# Patient Record
Sex: Female | Born: 1970 | Race: Black or African American | Hispanic: No | Marital: Single | Smoking: Current every day smoker
Health system: Southern US, Community
[De-identification: ages and names within clinical notes are randomized; demographics above are authoritative.]

## PROBLEM LIST (undated history)

## (undated) DIAGNOSIS — Z8489 Family history of other specified conditions: Secondary | ICD-10-CM

## (undated) DIAGNOSIS — Z1159 Encounter for screening for other viral diseases: Secondary | ICD-10-CM

## (undated) DIAGNOSIS — B009 Herpesviral infection, unspecified: Secondary | ICD-10-CM

## (undated) DIAGNOSIS — R768 Other specified abnormal immunological findings in serum: Secondary | ICD-10-CM

## (undated) DIAGNOSIS — M5030 Other cervical disc degeneration, unspecified cervical region: Secondary | ICD-10-CM

## (undated) DIAGNOSIS — G43909 Migraine, unspecified, not intractable, without status migrainosus: Secondary | ICD-10-CM

## (undated) DIAGNOSIS — R87619 Unspecified abnormal cytological findings in specimens from cervix uteri: Secondary | ICD-10-CM

## (undated) HISTORY — DX: Other specified abnormal immunological findings in serum: R76.8

## (undated) HISTORY — PX: LAPAROSCOPY: SHX197

## (undated) HISTORY — PX: DILATION AND CURETTAGE OF UTERUS: SHX78

## (undated) HISTORY — PX: CHOLECYSTECTOMY: SHX55

## (undated) HISTORY — DX: Encounter for screening for other viral diseases: Z11.59

## (undated) HISTORY — DX: Herpesviral infection, unspecified: B00.9

## (undated) HISTORY — PX: CERVICAL BIOPSY  W/ LOOP ELECTRODE EXCISION: SUR135

## (undated) HISTORY — DX: Unspecified abnormal cytological findings in specimens from cervix uteri: R87.619

## (undated) HISTORY — DX: Other cervical disc degeneration, unspecified cervical region: M50.30

## (undated) HISTORY — PX: BACK SURGERY: SHX140

## (undated) HISTORY — PX: CRYOTHERAPY: SHX1416

---

## 2003-11-09 ENCOUNTER — Other Ambulatory Visit: Payer: Self-pay

## 2004-02-16 ENCOUNTER — Emergency Department: Payer: Self-pay | Admitting: Unknown Physician Specialty

## 2004-02-18 HISTORY — PX: TUBAL LIGATION: SHX77

## 2004-04-12 ENCOUNTER — Emergency Department: Payer: Self-pay | Admitting: Emergency Medicine

## 2004-04-15 ENCOUNTER — Ambulatory Visit: Payer: Self-pay | Admitting: Emergency Medicine

## 2004-10-11 ENCOUNTER — Emergency Department: Payer: Self-pay | Admitting: Emergency Medicine

## 2004-10-12 ENCOUNTER — Other Ambulatory Visit: Payer: Self-pay

## 2004-10-15 ENCOUNTER — Ambulatory Visit: Payer: Self-pay

## 2005-03-16 ENCOUNTER — Emergency Department: Payer: Self-pay | Admitting: Emergency Medicine

## 2005-06-07 ENCOUNTER — Emergency Department: Payer: Self-pay | Admitting: Emergency Medicine

## 2006-01-11 ENCOUNTER — Emergency Department: Payer: Self-pay | Admitting: Internal Medicine

## 2006-01-14 ENCOUNTER — Emergency Department: Payer: Self-pay | Admitting: Internal Medicine

## 2006-01-16 ENCOUNTER — Ambulatory Visit: Payer: Self-pay | Admitting: Internal Medicine

## 2006-02-05 ENCOUNTER — Ambulatory Visit: Payer: Self-pay | Admitting: Pain Medicine

## 2006-03-24 ENCOUNTER — Ambulatory Visit: Payer: Self-pay | Admitting: Pain Medicine

## 2006-04-08 ENCOUNTER — Emergency Department: Payer: Self-pay | Admitting: Internal Medicine

## 2006-05-05 ENCOUNTER — Ambulatory Visit: Payer: Self-pay | Admitting: Pain Medicine

## 2006-05-20 ENCOUNTER — Ambulatory Visit: Payer: Self-pay | Admitting: Physician Assistant

## 2006-08-03 ENCOUNTER — Ambulatory Visit: Payer: Self-pay | Admitting: Unknown Physician Specialty

## 2006-08-10 ENCOUNTER — Observation Stay: Payer: Self-pay | Admitting: Unknown Physician Specialty

## 2006-10-26 ENCOUNTER — Emergency Department: Payer: Self-pay | Admitting: Emergency Medicine

## 2006-10-28 ENCOUNTER — Ambulatory Visit: Payer: Self-pay | Admitting: Physician Assistant

## 2007-11-15 ENCOUNTER — Ambulatory Visit: Payer: Self-pay | Admitting: Internal Medicine

## 2008-11-21 ENCOUNTER — Ambulatory Visit: Payer: Self-pay | Admitting: Neurosurgery

## 2009-05-31 ENCOUNTER — Ambulatory Visit: Payer: Self-pay | Admitting: Internal Medicine

## 2010-01-14 ENCOUNTER — Emergency Department: Payer: Self-pay | Admitting: Emergency Medicine

## 2010-12-05 ENCOUNTER — Emergency Department: Payer: Self-pay | Admitting: Emergency Medicine

## 2014-06-18 ENCOUNTER — Emergency Department
Admission: EM | Admit: 2014-06-18 | Discharge: 2014-06-18 | Disposition: A | Discharge status: Home / Self Care | Payer: Medicaid Other | Attending: Student | Admitting: Student

## 2014-06-18 ENCOUNTER — Emergency Department: Payer: Medicaid Other

## 2014-06-18 ENCOUNTER — Encounter: Payer: Self-pay | Admitting: *Deleted

## 2014-06-18 DIAGNOSIS — Y998 Other external cause status: Secondary | ICD-10-CM | POA: Insufficient documentation

## 2014-06-18 DIAGNOSIS — R52 Pain, unspecified: Secondary | ICD-10-CM

## 2014-06-18 DIAGNOSIS — Y9389 Activity, other specified: Secondary | ICD-10-CM | POA: Insufficient documentation

## 2014-06-18 DIAGNOSIS — S3992XA Unspecified injury of lower back, initial encounter: Secondary | ICD-10-CM | POA: Diagnosis not present

## 2014-06-18 DIAGNOSIS — Y9289 Other specified places as the place of occurrence of the external cause: Secondary | ICD-10-CM | POA: Insufficient documentation

## 2014-06-18 DIAGNOSIS — S4992XA Unspecified injury of left shoulder and upper arm, initial encounter: Secondary | ICD-10-CM | POA: Diagnosis present

## 2014-06-18 DIAGNOSIS — X58XXXA Exposure to other specified factors, initial encounter: Secondary | ICD-10-CM | POA: Insufficient documentation

## 2014-06-18 DIAGNOSIS — S46912A Strain of unspecified muscle, fascia and tendon at shoulder and upper arm level, left arm, initial encounter: Secondary | ICD-10-CM

## 2014-06-18 DIAGNOSIS — Z720 Tobacco use: Secondary | ICD-10-CM | POA: Insufficient documentation

## 2014-06-18 MED ORDER — CYCLOBENZAPRINE HCL 10 MG PO TABS: 10.0000 mg | ORAL_TABLET | Freq: Three times a day (TID) | ORAL | Status: AC | PRN

## 2014-06-18 MED ORDER — IBUPROFEN 800 MG PO TABS: 800.0000 mg | ORAL_TABLET | Freq: Three times a day (TID) | ORAL | Status: DC | PRN

## 2014-06-18 NOTE — ED Notes (Signed)
Patient stated her pain was 8/10

## 2014-06-18 NOTE — ED Provider Notes (Signed)
[facility] Emergency Department Provider Note    ____________________________________________  Time seen: 1430 hrs. I have reviewed the triage vital signs and the nursing notes.   HISTORY  Chief Complaint Arm Injury  44 year old female complaining of left scapular and shoulder pain 2 days. Patient states she had mild discomfort roughhousing with her son 2 days ago.  Patient states pain increase yesterday upon awakening.      HPI Theresa Ryan is a 44 y.o. female complaining of left shoulder pain but 2 days. Patient stated mild discomfort status post roughhousing with son 2 days ago, awakened yesterday morning with increased pain. Patient states the location of the pain is the left scapular area radiating to the [facility] joint. Patient is currently rated the pain as 8/10 she describes pain as sharp. Denies any other physical exertion today area patient states pain increases when she abducts the arm.Patient states the pain decreases the hold that and affect physician across the body. Patient denies any loss of sensation to the left upper extremity.   History reviewed. No pertinent past medical history.  There are no active problems to display for this patient.   Past Surgical History  Procedure Laterality Date  . Back surgery    . Cholecystectomy    . Cesarean section      No current outpatient prescriptions on file.  Allergies Vicoprofen  No family history on file.  Social History History  Substance Use Topics  . Smoking status: Current Every Day Smoker  . Smokeless tobacco: Not on file  . Alcohol Use: Yes    Review of Systems  Constitutional: Negative for fever. Eyes: Negative for visual changes. ENT: Negative for sore throat. Cardiovascular: Negative for chest pain. Respiratory: Negative for shortness of breath. Gastrointestinal: Negative for abdominal pain, vomiting and diarrhea. Genitourinary: Negative for  dysuria. Musculoskeletal: Positive for back pain. Skin: Negative for rash. Neurological: Negative for headaches, focal weakness or numbness. Psychiatric:None Endocrine:None Hematological/Lymphatic: Allergic/Immunilogical: **}  10-point ROS otherwise negative.  ____________________________________________   PHYSICAL EXAM:  VITAL SIGNS: ED Triage Vitals  Enc Vitals Group     BP 06/18/14 1544 140/65 mmHg     Pulse Rate 06/18/14 1544 76     Resp --      Temp 06/18/14 1544 98.2 F (36.8 C)     Temp Source 06/18/14 1544 Oral     SpO2 06/18/14 1544 100 %     Weight 06/18/14 1544 140 lb (63.504 kg)     Height 06/18/14 1544  (1.626 m)     Head Cir --      Peak Flow --      Pain Score 06/18/14 1545 8     Pain Loc --      Pain Edu? --      Excl. in GC? --      Constitutional: Alert and oriented. Well appearing and in no distress. Eyes: Conjunctivae are normal. PERRL. Normal extraocular movements. ENT   Head: Normocephalic and atraumatic.   Nose: No congestion/rhinnorhea.   Mouth/Throat: Mucous membranes are moist.   Neck: No stridor. Hematological/Lymphatic/Immunilogical: No cervical lymphadenopathy. Cardiovascular: Normal rate, regular rhythm. Normal and symmetric distal pulses are present in all extremities. No murmurs, rubs, or gallops. Respiratory: Normal respiratory effort without tachypnea nor retractions. Breath sounds are clear and equal bilaterally. No wheezes/rales/rhonchi. Gastrointestinal: Soft and nontender. No distention. No abdominal bruits. There is no CVA tenderness. Genitourinary: Normal Musculoskeletal: Nontender with normal range of motion in all extremities. No joint  effusions.  No lower extremity tenderness nor edema. Neurologic:  Normal speech and language. No gross focal neurologic deficits are appreciated. Speech is normal. No gait instability. Skin:  Skin is warm, dry and intact. No rash noted. Psychiatric: Mood and affect are  normal. Speech and behavior are normal. Patient exhibits appropriate insight and judgment.  ____________________________________________   EKG    ____________________________________________    RADIOLOGY Negative left shoulder X-ray  ____________________________________________   PROCEDURES  Procedure(s) performed: None  Critical Care performed: None  ____________________________________________   INITIAL IMPRESSION / ASSESSMENT AND PLAN / ED COURSE  Pertinent labs & imaging results that were available during my care of the patient were reviewed by me and considered in my medical decision making (see chart for details).    ____________________________________________   FINAL CLINICAL IMPRESSION(S) / ED DIAGNOSES  Final diagnoses:  Pain aggravated by activities of daily living  Shoulder strain, left, initial encounter    Theresa ReiningRonald K Terilyn Sano, PA-C 06/18/14 1755  Gayla DossEryka A Gayle, MD 06/19/14 [phone]

## 2014-06-18 NOTE — ED Notes (Signed)
Pt reports she was "doing some machine" work on Friday and felt pain in her arm. Pt then states she was playing with her son and had worsening arm pain.

## 2014-08-10 ENCOUNTER — Encounter: Payer: Self-pay | Admitting: *Deleted

## 2014-08-10 ENCOUNTER — Emergency Department
Admission: EM | Admit: 2014-08-10 | Discharge: 2014-08-10 | Disposition: A | Discharge status: Home / Self Care | Payer: Medicaid Other | Attending: Emergency Medicine | Admitting: Emergency Medicine

## 2014-08-10 DIAGNOSIS — Z720 Tobacco use: Secondary | ICD-10-CM | POA: Diagnosis not present

## 2014-08-10 DIAGNOSIS — J039 Acute tonsillitis, unspecified: Secondary | ICD-10-CM | POA: Diagnosis not present

## 2014-08-10 DIAGNOSIS — J029 Acute pharyngitis, unspecified: Secondary | ICD-10-CM | POA: Diagnosis present

## 2014-08-10 LAB — POCT RAPID STREP A: Streptococcus, Group A Screen (Direct): NEGATIVE

## 2014-08-10 MED ORDER — AMOXICILLIN 500 MG PO CAPS: 500.0000 mg | ORAL_CAPSULE | Freq: Three times a day (TID) | ORAL | Status: DC

## 2014-08-10 NOTE — Discharge Instructions (Signed)
Tonsillitis Tonsillitis is an infection of the throat that causes the tonsils to become red, tender, and swollen. Tonsils are collections of lymphoid tissue at the back of the throat. Each tonsil has crevices (crypts). Tonsils help fight nose and throat infections and keep infection from spreading to other parts of the body for the first 18 months of life.  CAUSES Sudden (acute) tonsillitis is usually caused by infection with streptococcal bacteria. Long-lasting (chronic) tonsillitis occurs when the crypts of the tonsils become filled with pieces of food and bacteria, which makes it easy for the tonsils to become repeatedly infected. SYMPTOMS  Symptoms of tonsillitis include:  A sore throat, with possible difficulty swallowing.  White patches on the tonsils.  Fever.  Tiredness.  New episodes of snoring during sleep, when you did not snore before.  Small, foul-smelling, yellowish-white pieces of material (tonsilloliths) that you occasionally cough up or spit out. The tonsilloliths can also cause you to have bad breath. DIAGNOSIS Tonsillitis can be diagnosed through a physical exam. Diagnosis can be confirmed with the results of lab tests, including a throat culture. TREATMENT  The goals of tonsillitis treatment include the reduction of the severity and duration of symptoms and prevention of associated conditions. Symptoms of tonsillitis can be improved with the use of steroids to reduce the swelling. Tonsillitis caused by bacteria can be treated with antibiotic medicines. Usually, treatment with antibiotic medicines is started before the cause of the tonsillitis is known. However, if it is determined that the cause is not bacterial, antibiotic medicines will not treat the tonsillitis. If attacks of tonsillitis are severe and frequent, your health care provider may recommend surgery to remove the tonsils (tonsillectomy). HOME CARE INSTRUCTIONS   Rest as much as possible and get plenty of  sleep.  Drink plenty of fluids. While the throat is very sore, eat soft foods or liquids, such as sherbet, soups, or instant breakfast drinks.  Eat frozen ice pops.  Gargle with a warm or cold liquid to help soothe the throat. Mix 1/4 teaspoon of salt and 1/4 teaspoon of baking soda in 8 oz of water. SEEK MEDICAL CARE IF:   Large, tender lumps develop in your neck.  A rash develops.  A green, yellow-brown, or bloody substance is coughed up.  You are unable to swallow liquids or food for 24 hours.  You notice that only one of the tonsils is swollen. SEEK IMMEDIATE MEDICAL CARE IF:   You develop any new symptoms such as vomiting, severe headache, stiff neck, chest pain, or trouble breathing or swallowing.  You have severe throat pain along with drooling or voice changes.  You have severe pain, unrelieved with recommended medications.  You are unable to fully open the mouth.  You develop redness, swelling, or severe pain anywhere in the neck.  You have a fever. MAKE SURE YOU:   Understand these instructions.  Will watch your condition.  Will get help right away if you are not doing well or get worse. Document Released: 11/13/2004 Document Revised: 06/20/2013 Document Reviewed: 07/23/2012 [facility] Patient Information 2015 [place], [place]. This information is not intended to replace advice given to you by your health care provider. Make sure you discuss any questions you have with your health care provider.  You are being treated for an acute tonsillitis, despite your rapid strep test being negative, with antibiotics.  Take the prescription meds as directed, and follow-up with your provider as needed.

## 2014-08-10 NOTE — ED Provider Notes (Signed)
[facility] Emergency Department Provider Note ____________________________________________  Time seen: 1750  I have reviewed the triage vital signs and the nursing notes.  HISTORY  Chief Complaint  Sore Throat  HPI Theresa Ryan is a 44 y.o. female reports to the ED with a sore throat since Sunday. She denies outright fevers, chills, or sweats, but it is noted she takes ibuprofen regularly for a concurrent shoulder injury. She notes pain with swallowing, as well as some fullness to the tonsils with redness and visible exudate when she opens her mouth. She denies any other sick contacts with the exception of her grandbaby was recently diagnosed with a coxsackievirus.  History reviewed. No pertinent past medical history.  There are no active problems to display for this patient.  Past Surgical History  Procedure Laterality Date  . Back surgery    . Cholecystectomy    . Cesarean section      Current Outpatient Rx  Name  Route  Sig  Dispense  Refill  . amoxicillin (AMOXIL) 500 MG capsule   Oral   Take 1 capsule (500 mg total) by mouth 3 (three) times daily.   30 capsule   0   . cyclobenzaprine (FLEXERIL) 10 MG tablet   Oral   Take 1 tablet (10 mg total) by mouth every 8 (eight) hours as needed for muscle spasms.   30 tablet   1   . ibuprofen (ADVIL,MOTRIN) 800 MG tablet   Oral   Take 1 tablet (800 mg total) by mouth every 8 (eight) hours as needed for moderate pain.   30 tablet   0    Allergies Vicoprofen  History reviewed. No pertinent family history.  Social History History  Substance Use Topics  . Smoking status: Current Every Day Smoker  . Smokeless tobacco: Not on file  . Alcohol Use: Yes   Review of Systems  Constitutional: Negative for fever. Eyes: Negative for visual changes. ENT: Positive for sore throat. Cardiovascular: Negative for chest pain. Respiratory: Negative for shortness of breath. Gastrointestinal: Negative  for abdominal pain, vomiting and diarrhea. Genitourinary: Negative for dysuria. Musculoskeletal: Negative for back pain. Skin: Negative for rash. Neurological: Negative for headaches, focal weakness or numbness. ____________________________________________  PHYSICAL EXAM:  VITAL SIGNS: ED Triage Vitals  Enc Vitals Group     BP 08/10/14 1651 99/73 mmHg     Pulse Rate 08/10/14 1651 79     Resp 08/10/14 1651 18     Temp 08/10/14 1651 99.2 F (37.3 C)     Temp Source 08/10/14 1651 Oral     SpO2 08/10/14 1651 100 %     Weight 08/10/14 1651 145 lb (65.772 kg)     Height 08/10/14 1651 5\' 5"  (1.651 m)     Head Cir --      Peak Flow --      Pain Score 08/10/14 1650 7     Pain Loc --      Pain Edu? --      Excl. in GC? --    Constitutional: Alert and oriented. Well appearing and in no distress. Eyes: Conjunctivae are normal. PERRL. Normal extraocular movements. ENT   Head: Normocephalic and atraumatic.   Nose: No congestion/rhinnorhea.   Mouth/Throat: Mucous membranes are moist. Tonsils erythematous with exudate bilaterally.   Neck: Supple. No thyromegaly. Hematological/Lymphatic/Immunilogical: Upper anterior cervical lymphadenopathy noted. Cardiovascular: Normal rate, regular rhythm.  Respiratory: Normal respiratory effort. No wheezes/rales/rhonchi. Gastrointestinal: Soft and nontender. No distention. Musculoskeletal: Nontender with normal range  of motion in all extremities.  Neurologic:  Normal gait without ataxia. Normal speech and language. No gross focal neurologic deficits are appreciated. Skin:  Skin is warm, dry and intact. No rash noted. Psychiatric: Mood and affect are normal. Patient exhibits appropriate insight and judgment. ____________________________________________    LABS (pertinent positives/negatives)  Rapid Strep - Negative Throat Culture - pending ____________________________________________  INITIAL IMPRESSION / ASSESSMENT AND PLAN / ED  COURSE  Empiric treatment for strep tonsillitis despite negative point-of-care test. Patient given Amoxicillin 500 mg TID x 10 days.  Follow-up with primary provider as needed. ____________________________________________  FINAL CLINICAL IMPRESSION(S) / ED DIAGNOSES  Final diagnoses:  Sore throat  Tonsillitis with exudate     Lissa Hoard, PA-C 08/10/14 1852  Jene Every, MD 08/10/14 2147

## 2014-08-10 NOTE — ED Notes (Signed)
Pt arrives with complaints of sore throat for several days, pt states her grandbaby as sick last week, pt denies fever

## 2014-08-10 NOTE — ED Notes (Signed)
White purulent area noted on left tonsil

## 2014-08-13 LAB — CULTURE, GROUP A STREP (THRC): Special Requests: NORMAL

## 2016-03-28 IMAGING — CR DG SHOULDER 2+V*L*
1 series · 3 of 3 positions shown · non-contrast
Comparison: None.

CLINICAL DATA: Left shoulder pain aggravated by activities of daily
living

EXAM:
LEFT SHOULDER - 2+ VIEW

[Series 1: dg shoulder left · 0.14mm/px · 3 of 3 slices shown]
[im 1/3]
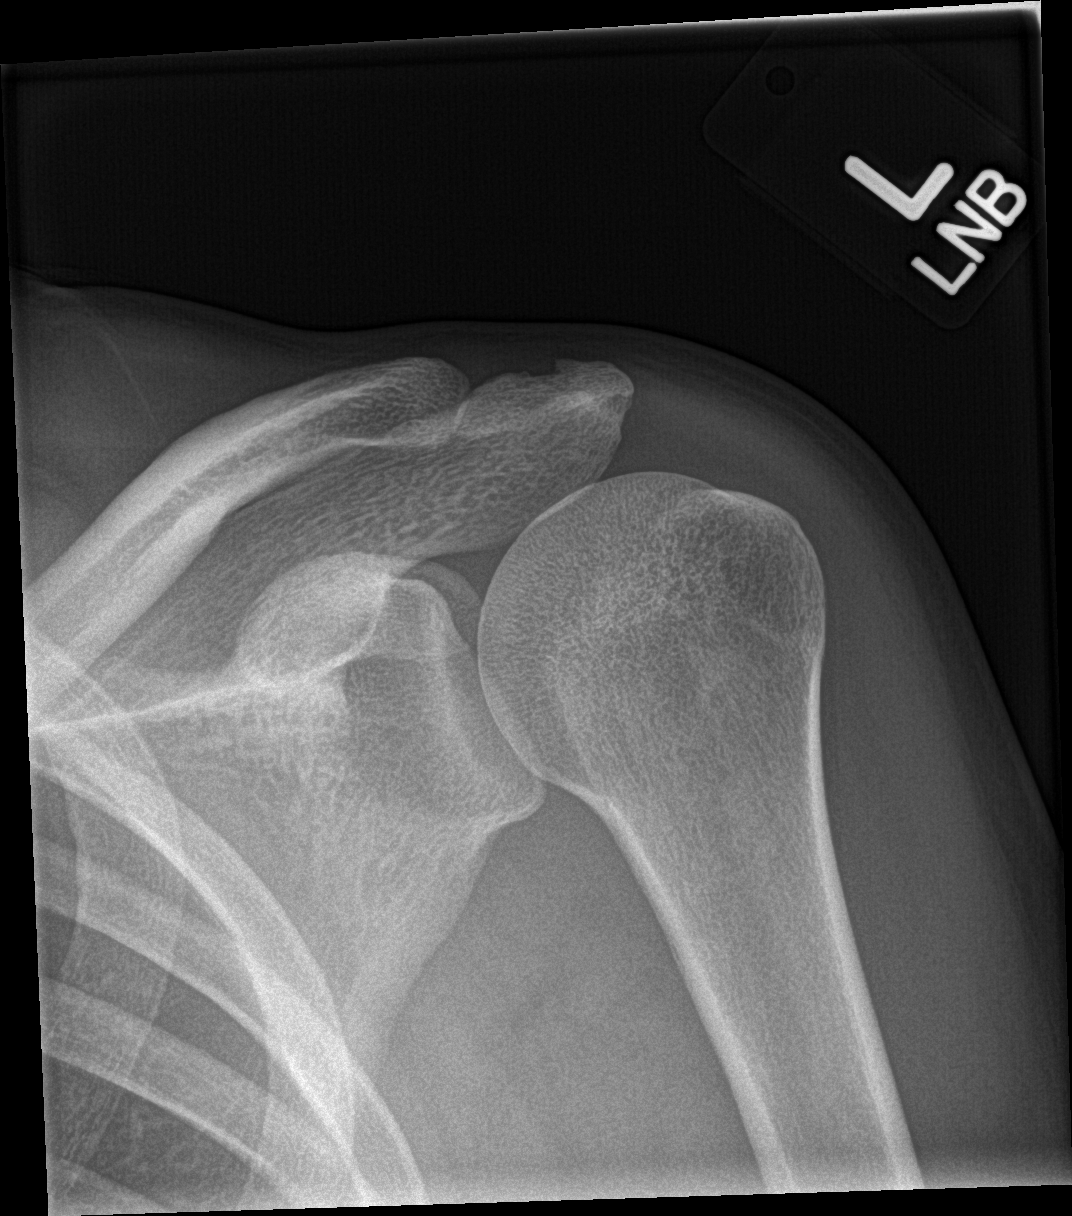
[im 2/3]
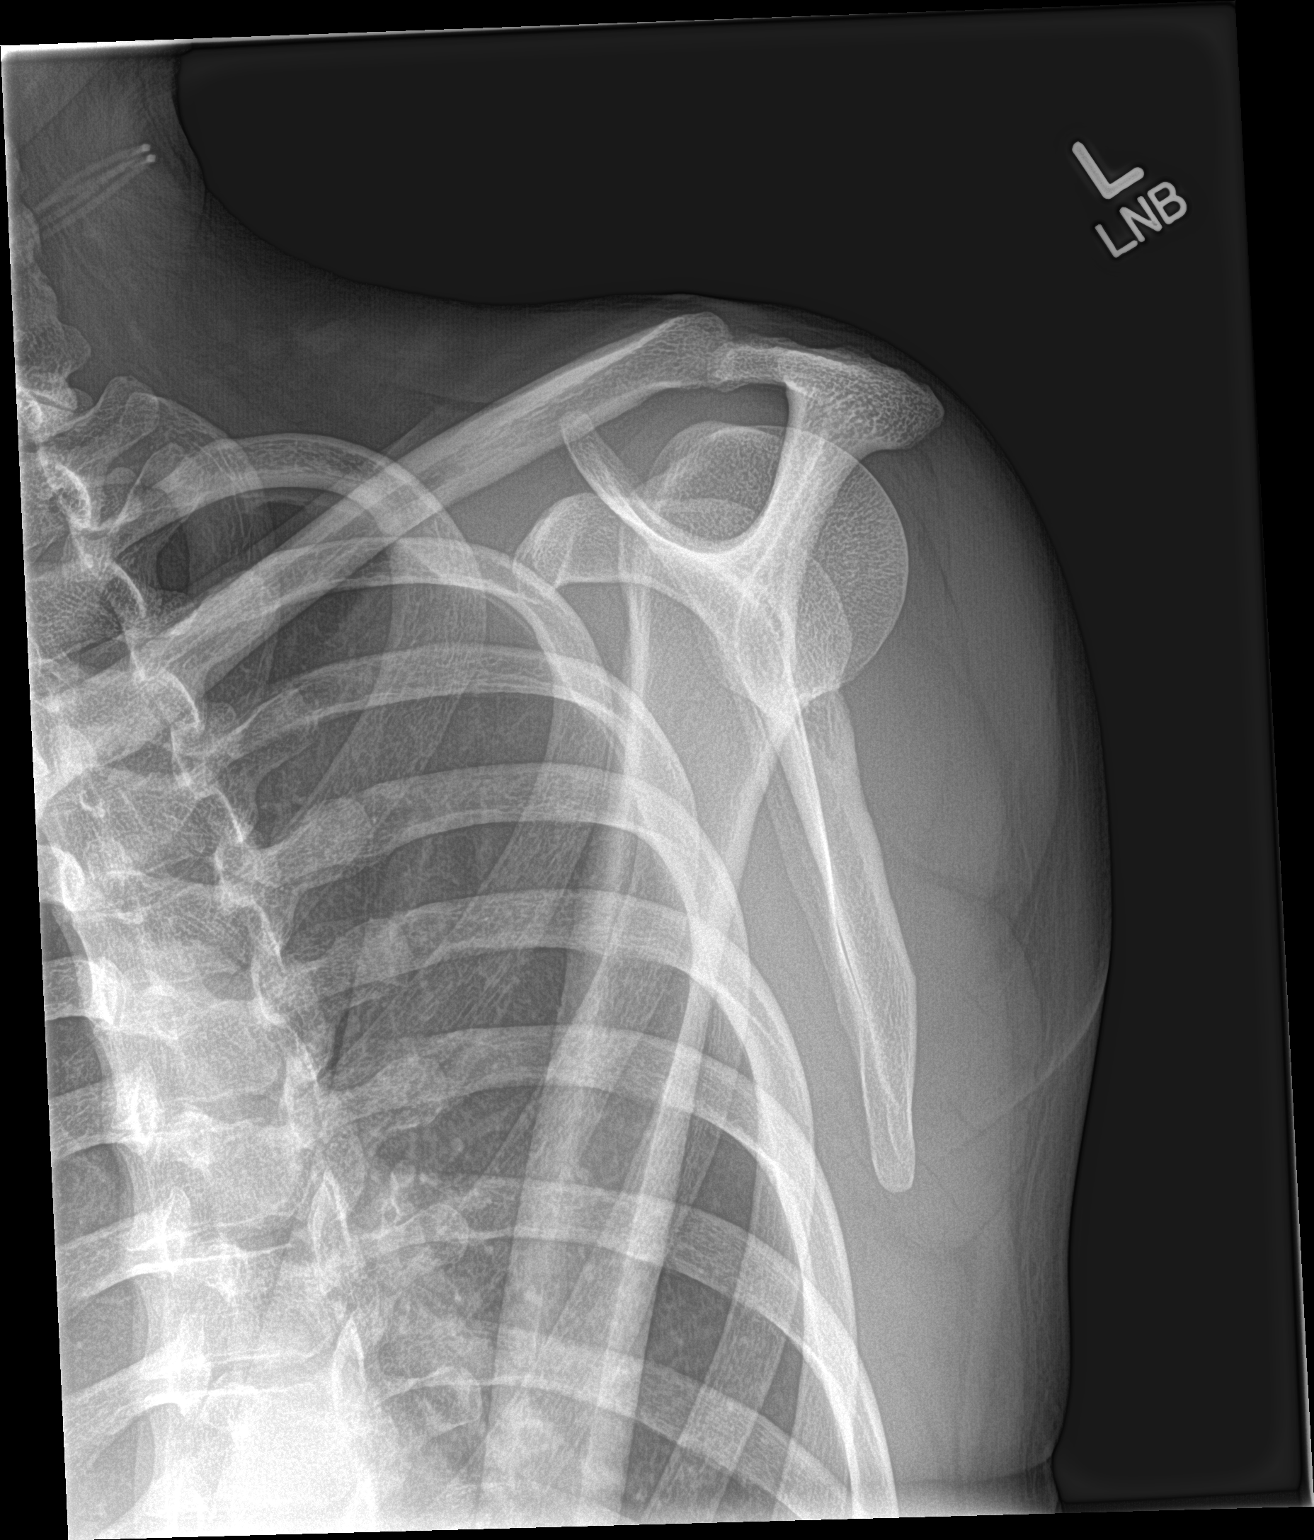
[im 3/3]
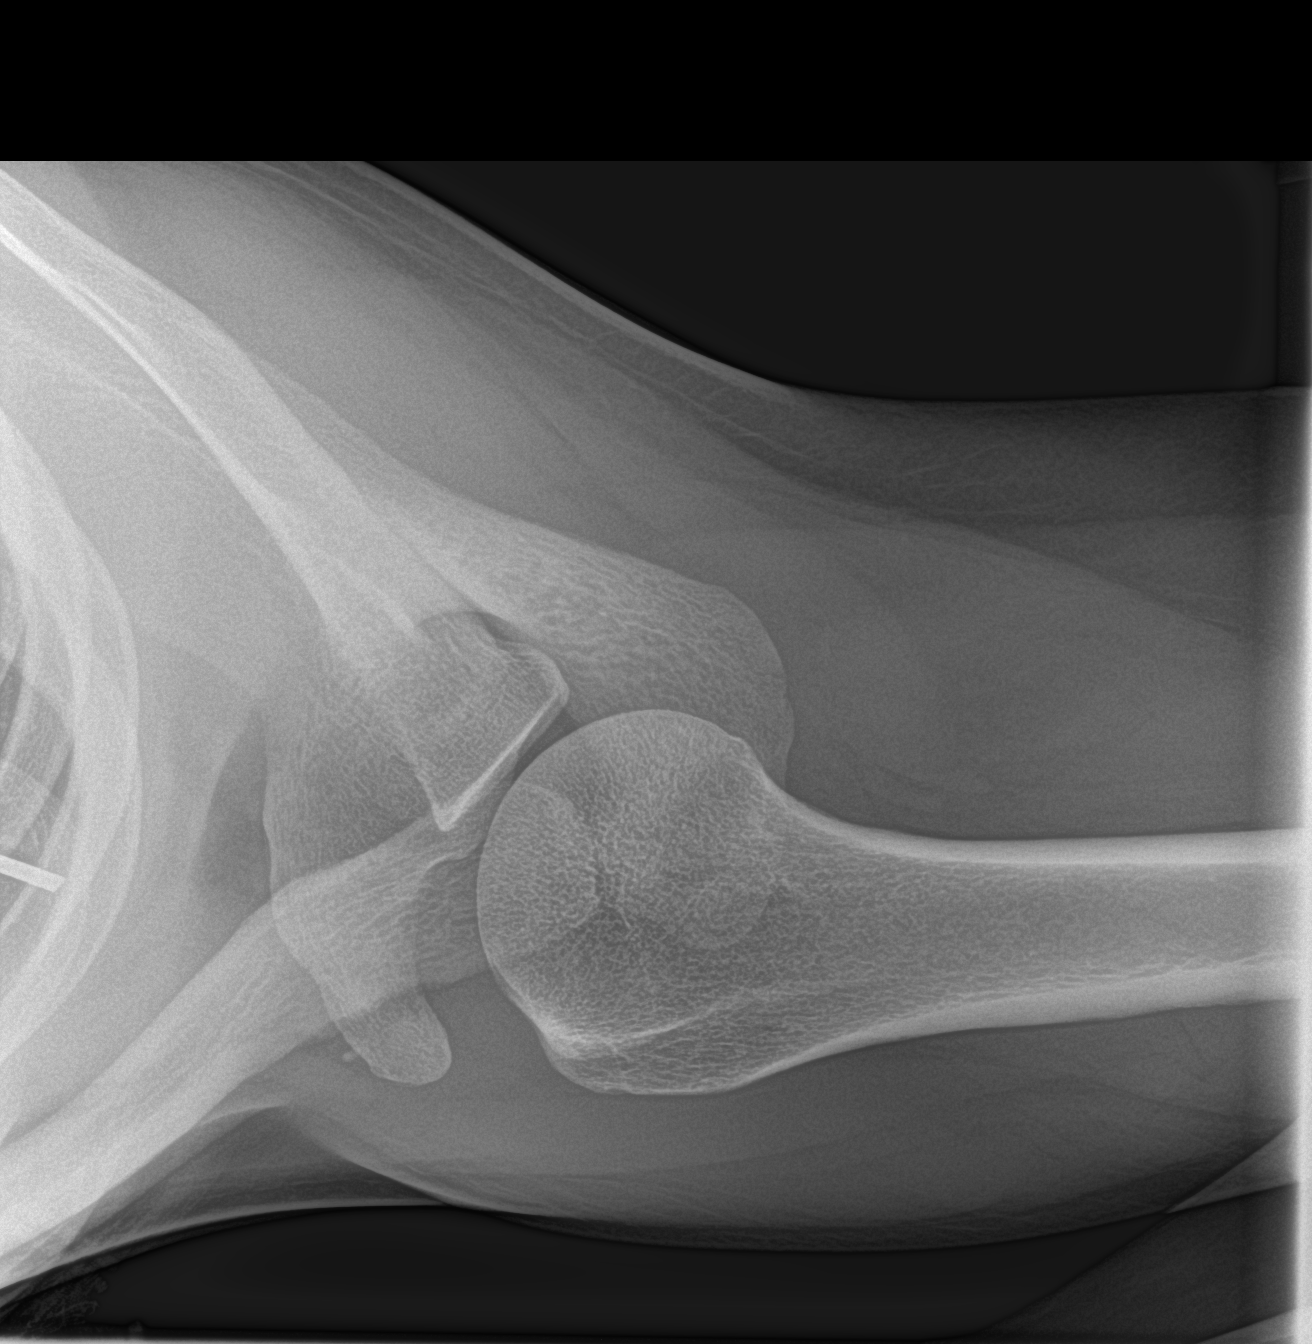

[3 of 3 positions shown; findings below may reference images not displayed]

FINDINGS: There is no evidence of fracture or dislocation. There is no
evidence of arthropathy or other focal bone abnormality. Soft
tissues are unremarkable.
IMPRESSION: Negative.

## 2016-10-22 ENCOUNTER — Ambulatory Visit (INDEPENDENT_AMBULATORY_CARE_PROVIDER_SITE_OTHER): Payer: Medicaid Other | Admitting: Obstetrics and Gynecology

## 2016-10-22 ENCOUNTER — Encounter: Payer: Self-pay | Admitting: Obstetrics and Gynecology

## 2016-10-22 VITALS — BP 121/80 | HR 92 | Ht 65.0 in | Wt 152.8 lb

## 2016-10-22 DIAGNOSIS — Z87410 Personal history of cervical dysplasia: Secondary | ICD-10-CM

## 2016-10-22 DIAGNOSIS — Z720 Tobacco use: Secondary | ICD-10-CM

## 2016-10-22 DIAGNOSIS — Z98891 History of uterine scar from previous surgery: Secondary | ICD-10-CM

## 2016-10-22 DIAGNOSIS — Z9851 Tubal ligation status: Secondary | ICD-10-CM | POA: Diagnosis not present

## 2016-10-22 DIAGNOSIS — N939 Abnormal uterine and vaginal bleeding, unspecified: Secondary | ICD-10-CM

## 2016-10-22 DIAGNOSIS — Z9889 Other specified postprocedural states: Secondary | ICD-10-CM

## 2016-10-22 DIAGNOSIS — R102 Pelvic and perineal pain unspecified side: Secondary | ICD-10-CM

## 2016-10-22 LAB — POCT URINALYSIS DIPSTICK
Glucose, UA: NEGATIVE
Ketones, UA: NEGATIVE
LEUKOCYTES UA: NEGATIVE
Nitrite, UA: NEGATIVE
PH UA: 6 (ref 5.0–8.0)
SPEC GRAV UA: 1.02 (ref 1.010–1.025)
Urobilinogen, UA: 0.2 E.U./dL

## 2016-10-22 LAB — POCT URINE PREGNANCY: PREG TEST UR: NEGATIVE

## 2016-10-22 NOTE — Progress Notes (Signed)
GYN ENCOUNTER NOTE  Subjective:       Theresa Ryan is a 46 y.o. [phone] female is here for gynecologic evaluation of the following issues:  1. Abnormal uterine bleeding 2. Pelvic pain   Menstrual history: First cycle at age 89 (once) Menarche-age 46 Dysmenorrhea-negative Abnormal uterine bleeding began approximately age 46; intervals range from this 17-28 days; duration of flow is 4 days with the exception of a very heavy cycle in May 2018 where she bled 8 days with associated clots.   Gynecologic History Patient's last menstrual period was 10/20/2016 (exact date). Contraception: tubal ligation Last Pap: Normal (per patient history-by Dr. Maryellen PileEason) Last mammogram: 05/31/2009 BI-RADS 2 History of cervical dysplasia-[facility] in the late 1990s beginning in 1996. History of LEEP cone biopsy in 2006 [facility] W[facility] History of laparoscopic adhesiolysis and tubal ligation in 2006 by Dr. Luella Cookosenow History of D&C in 2006 by Dr. Luella Cookosenow  Obstetric History OB History  Gravida Para Term Preterm AB Living  6 4 2 2 2 3   SAB TAB Ectopic Multiple Live Births    2     3    # Outcome Date GA Lbr Len/2nd Weight Sex Delivery Anes PTL Lv  6 Preterm 2006        FD  5 Preterm 2000     CS-LTranv   LIV     Complications: Abruptio Placenta  4 TAB 1998          3 Term 31995    F Vag-Spont   LIV  2 TAB 1991          1 Term 711990    F Vag-Spont   LIV      Past Medical History:  Diagnosis Date  . Abnormal Pap smear of cervix   . DDD (degenerative disc disease), cervical     Past Surgical History:  Procedure Laterality Date  . BACK SURGERY    . CERVICAL BIOPSY  W/ LOOP ELECTRODE EXCISION    . CESAREAN SECTION    . CHOLECYSTECTOMY    . CRYOTHERAPY    . DILATION AND CURETTAGE OF UTERUS    . LAPAROSCOPY    . TUBAL LIGATION  2006    No current outpatient prescriptions on file prior to visit.   No current facility-administered medications on file  prior to visit.     Allergies  Allergen Reactions  . Vicoprofen [Hydrocodone-Ibuprofen] Other (See Comments)    Chest pains    Social History   Social History  . Marital status: Single    Spouse name: N/A  . Number of children: N/A  . Years of education: N/A   Occupational History  . Not on file.   Social History Main Topics  . Smoking status: Current Every Day Smoker    Packs/day: 0.50    Types: Cigarettes  . Smokeless tobacco: Never Used  . Alcohol use No  . Drug use: No  . Sexual activity: Yes    Birth control/ protection: Surgical   Other Topics Concern  . Not on file   Social History Narrative  . No narrative on file    Family History  Problem Relation Age of Onset  . Diabetes Mother   . Colon cancer Maternal Grandmother   . Diabetes Daughter   . Breast cancer Neg Hx   . Ovarian cancer Neg Hx     The following portions of the patient's history were reviewed and updated as appropriate:  allergies, current medications, past family history, past medical history, past social history, past surgical history and problem list.  Review of Systems Review of Systems  Constitutional: Negative.        No vasomotor symptoms  HENT: Negative.   Eyes: Negative.   Respiratory: Negative.   Cardiovascular: Negative.   Gastrointestinal: Negative.   Genitourinary:       Irregular menstrual cycles-see history of present illness  Musculoskeletal: Negative.   Skin: Negative.   Neurological: Negative.   Endo/Heme/Allergies: Negative.   Psychiatric/Behavioral: Negative.      Objective:   BP 121/80   Pulse 92   Ht 5\' 5"  (1.651 m)   Wt 152 lb 12.8 oz (69.3 kg)   LMP 10/20/2016 (Exact Date)   BMI 25.43 kg/m  CONSTITUTIONAL: Well-developed, well-nourished female in no acute distress.  HENT:  Normocephalic, atraumatic.  NECK: Normal range of motion, supple, no masses.  Normal thyroid.  SKIN: Skin is warm and dry. No rash noted. Not diaphoretic. No erythema. No  pallor. NEUROLGIC: Alert and oriented to person, place, and time. PSYCHIATRIC: Normal mood and affect. Normal behavior. Normal judgment and thought content. CARDIOVASCULAR:Not Examined RESPIRATORY: Not Examined BREASTS: Not Examined BACK: No CVA tenderness or spinal tenderness ABDOMEN: Soft, non distended; Non tender.  No Organomegaly. PELVIC:  External Genitalia: Normal; blood stained perineum  BUS: Normal  Vagina: Normal; no lesions; menstrual blood in vault  Cervix: Normal; parous; no cervical motion tenderness  Uterus: 10 week size, irregular shape, mobile, nontender, anterior  Adnexa: Normal; nonpalpable and nontender  RV: Normal external exam  Bladder: Nontender MUSCULOSKELETAL: Normal range of motion. No tenderness.  No cyanosis, clubbing, or edema.     Assessment:   1. Abnormal uterine bleeding (AUB) - POCT urine pregnancy - US PELVIS (TRANSABDOMINAL ONLY); Future - US PELVIS TRANSVANGINAL NON-OB (TV ONLY); Future  2. Pelvic pain - Urine Culture - POCT urinalysis dipstick - US PELVIS (TRANSABDOMINAL ONLY); Future - US PELVIS TRANSVANGINAL NON-OB (TV ONLY); Future  3. History of cervical dysplasia  4. Status post LEEP (loop electrosurgical excision procedure) of cervix, 2006  5. History of cesarean section, 28 weeks for abruption  6. History of tubal ligation  7. History of D&C  8. Tobacco user     Plan:   1. Endometrial biopsy Endometrial Biopsy Procedure Note  Pre-operative Diagnosis: Abnormal uterine bleeding  Post-operative Diagnosis: Abnormal uterine bleeding  Procedure Details   Urine pregnancy test was done  and result was negative.  The risks (including infection, bleeding, pain, and uterine perforation) and benefits of the procedure were explained to the patient and Verbal informed consent was obtained.  Antibiotic prophylaxis against endocarditis was not indicated.   The patient was placed in the dorsal lithotomy position.  Bimanual exam  showed the uterus to be in the anteroflexed position.  A Graves' speculum inserted in the vagina, and the cervix prepped with povidone iodine.  Endocervical curettage with a Kevorkian curette was not performed.   A sharp tenaculum was not applied to the anterior lip of the cervix for stabilization.  A sterile uterine sound was used to sound the uterus to a depth of 8cm.  A Mylex 3mm curette was used to sample the endometrium.  Sample was sent for pathologic examination.  Condition: Stable  Complications: None  Plan:  The patient was advised to call for any fever or for prolonged or severe pain or bleeding. She was advised to use OTC acetaminophen and OTC ibuprofen as needed for mild to  moderate pain. She was advised to avoid vaginal intercourse for 48 hours or until the bleeding has completely stopped.  Attending Physician Documentation: Herold Harms, MD  2. Pelvic ultrasound 3. Follow-up in 2 weeks for further management planning  Herold Harms, MD  Note: This dictation was prepared with Dragon dictation along with smaller phrase technology. Any transcriptional errors that result from this process are unintentional.

## 2016-10-22 NOTE — Patient Instructions (Signed)
1. Pelvic ultrasound is ordered 2. Endometrial biopsy is done today 3. Return in 2 weeks for follow-up on studies and further management planning   Endometrial Biopsy, Care After This sheet gives you information about how to care for yourself after your procedure. Your health care provider may also give you more specific instructions. If you have problems or questions, contact your health care provider. What can I expect after the procedure? After the procedure, it is common to have:  Mild cramping.  A small amount of vaginal bleeding for a few days. This is normal.  Follow these instructions at home:  Take over-the-counter and prescription medicines only as told by your health care provider.  Do not douche, use tampons, or have sexual intercourse until your health care provider approves.  Return to your normal activities as told by your health care provider. Ask your health care provider what activities are safe for you.  Follow instructions from your health care provider about any activity restrictions, such as restrictions on strenuous exercise or heavy lifting. Contact a health care provider if:  You have heavy bleeding, or bleed for longer than 2 days after the procedure.  You have bad smelling discharge from your vagina.  You have a fever or chills.  You have a burning sensation when urinating or you have difficulty urinating.  You have severe pain in your lower abdomen. Get help right away if:  You have severe cramps in your stomach or back.  You pass large blood clots.  Your bleeding increases.  You become weak or light-headed, or you pass out. Summary  After the procedure, it is common to have mild cramping and a small amount of vaginal bleeding for a few days.  Do not douche, use tampons, or have sexual intercourse until your health care provider approves.  Return to your normal activities as told by your health care provider. Ask your health care provider  what activities are safe for you. This information is not intended to replace advice given to you by your health care provider. Make sure you discuss any questions you have with your health care provider. Document Released: 11/24/2012 Document Revised: 02/20/2016 Document Reviewed: 02/20/2016 [employer] Interactive Patient Education  2017 [employer]

## 2016-10-24 LAB — URINE CULTURE: ORGANISM ID, BACTERIA: NO GROWTH

## 2016-10-24 LAB — PATHOLOGY

## 2016-10-28 ENCOUNTER — Ambulatory Visit (INDEPENDENT_AMBULATORY_CARE_PROVIDER_SITE_OTHER): Payer: Medicaid Other

## 2016-10-28 DIAGNOSIS — R102 Pelvic and perineal pain: Secondary | ICD-10-CM

## 2016-10-28 DIAGNOSIS — N939 Abnormal uterine and vaginal bleeding, unspecified: Secondary | ICD-10-CM

## 2016-11-05 ENCOUNTER — Ambulatory Visit (INDEPENDENT_AMBULATORY_CARE_PROVIDER_SITE_OTHER): Payer: Medicaid Other | Admitting: Obstetrics and Gynecology

## 2016-11-05 ENCOUNTER — Encounter: Payer: Self-pay | Admitting: Obstetrics and Gynecology

## 2016-11-05 VITALS — BP 115/71 | HR 84 | Ht 65.0 in | Wt 151.2 lb

## 2016-11-05 DIAGNOSIS — N939 Abnormal uterine and vaginal bleeding, unspecified: Secondary | ICD-10-CM | POA: Diagnosis not present

## 2016-11-05 NOTE — Patient Instructions (Signed)
1. Recommend Depo-Provera 150 mg IM every 3 months for management of abnormal uterine bleeding, or Provera 10 mg orally days 1 through 10 each month, or surgical endometrial ablation. 2. Call to schedule surgery or to establish medical treatment for abnormal uterine bleeding when you're decision is made    Endometrial Ablation Endometrial ablation is a procedure that destroys the thin inner layer of the lining of the uterus (endometrium). This procedure may be done:  To stop heavy periods.  To stop bleeding that is causing anemia.  To control irregular bleeding.  To treat bleeding caused by small tumors (fibroids) in the endometrium.  This procedure is often an alternative to major surgery, such as removal of the uterus and cervix (hysterectomy). As a result of this procedure:  You may not be able to have children. However, if you are premenopausal (you have not gone through menopause): ? You may still have a small chance of getting pregnant. ? You will need to use a reliable method of birth control after the procedure to prevent pregnancy.  You may stop having a menstrual period, or you may have only a small amount of bleeding during your period. Menstruation may return several years after the procedure.  Tell a health care provider about:  Any allergies you have.  All medicines you are taking, including vitamins, herbs, eye drops, creams, and over-the-counter medicines.  Any problems you or family members have had with the use of anesthetic medicines.  Any blood disorders you have.  Any surgeries you have had.  Any medical conditions you have. What are the risks? Generally, this is a safe procedure. However, problems may occur, including:  A hole (perforation) in the uterus or bowel.  Infection of the uterus, bladder, or vagina.  Bleeding.  Damage to other structures or organs.  An air bubble in the lung (air embolus).  Problems with pregnancy after the  procedure.  Failure of the procedure.  Decreased ability to diagnose cancer in the endometrium.  What happens before the procedure?  You will have tests of your endometrium to make sure there are no pre-cancerous cells or cancer cells present.  You may have an ultrasound of the uterus.  You may be given medicines to thin the endometrium.  Ask your health care provider about: ? Changing or stopping your regular medicines. This is especially important if you take diabetes medicines or blood thinners. ? Taking medicines such as aspirin and ibuprofen. These medicines can thin your blood. Do not take these medicines before your procedure if your doctor tells you not to.  Plan to have someone take you home from the hospital or clinic. What happens during the procedure?  You will lie on an exam table with your feet and legs supported as in a pelvic exam.  To lower your risk of infection: ? Your health care team will wash or sanitize their hands and put on germ-free (sterile) gloves. ? Your genital area will be washed with soap.  An IV tube will be inserted into one of your veins.  You will be given a medicine to help you relax (sedative).  A surgical instrument with a light and camera (resectoscope) will be inserted into your vagina and moved into your uterus. This allows your surgeon to see inside your uterus.  Endometrial tissue will be removed using one of the following methods: ? Radiofrequency. This method uses a radiofrequency-alternating electric current to remove the endometrium. ? Cryotherapy. This method uses extreme cold to freeze  the endometrium. ? Heated-free liquid. This method uses a heated saltwater (saline) solution to remove the endometrium. ? Microwave. This method uses high-energy microwaves to heat up the endometrium and remove it. ? Thermal balloon. This method involves inserting a catheter with a balloon tip into the uterus. The balloon tip is filled with heated  fluid to remove the endometrium. The procedure may vary among health care providers and hospitals. What happens after the procedure?  Your blood pressure, heart rate, breathing rate, and blood oxygen level will be monitored until the medicines you were given have worn off.  As tissue healing occurs, you may notice vaginal bleeding for 4-6 weeks after the procedure. You may also experience: ? Cramps. ? Thin, watery vaginal discharge that is light pink or brown in color. ? A need to urinate more frequently than usual. ? Nausea.  Do not drive for 24 hours if you were given a sedative.  Do not have sex or insert anything into your vagina until your health care provider approves. Summary  Endometrial ablation is done to treat the many causes of heavy menstrual bleeding.  The procedure may be done only after medications have been tried to control the bleeding.  Plan to have someone take you home from the hospital or clinic. This information is not intended to replace advice given to you by your health care provider. Make sure you discuss any questions you have with your health care provider. Document Released: 12/14/2003 Document Revised: 02/21/2016 Document Reviewed: 02/21/2016 Elsevier Interactive Patient Education  2017 [employer].

## 2016-11-05 NOTE — Progress Notes (Signed)
Chief complaint: 1. Abnormal uterine bleeding 2. Pelvic pain 3. History of cesarean section delivery 4. History of LEEP cone biopsy for dysplasia 5. Tobacco user  Patient presents for follow-up on endometrial biopsy and ultrasound done for evaluation of abnormal uterine bleeding.  Endometrial biopsy: Diagnosis:  ENDOMETRIUM, BIOPSY:  NO HYPERPLASIA OR CARCINOMA. BENIGN ENDOMETRIAL FRAGMENTS WITH BREAKDOWN  CHANGES.  SBI/10/24/2016   Pelvic ultrasound: ULTRASOUND REPORT  Location: [facility] Date of Service: 10/28/16   Indications:AUB Findings:  The uterus measures 8.5 x 4.5 x 5.3 cm. Echo texture is homogenous without evidence of focal masses.  The Endometrium measures 5.1 mm.  Right Ovary measures 2.4 x 1.5 x 1.7 cm. It is normal in appearance. Left Ovary measures 3.2 x 1.6 x 2.3 cm. It is normal appearance. Survey of the adnexa demonstrates no adnexal masses. There is no free fluid in the cul de sac.  Impression: 1. Normal appearing pelvic ultrasound  Recommendations: 1.Clinical correlation with the patient's History and Physical Exam.   Shirlean Schlein Jermichael Belmares, MD  OBJECTIVE: BP 115/71   Pulse 84   Ht  (1.651 m)   Wt 151 lb 3.2 oz (68.6 kg)   LMP 10/20/2016 (Exact Date)   BMI 25.16 kg/m  Physical exam-deferred  ASSESSMENT: 1. Abnormal uterine bleeding, likely anovulatory 2. Benign endometrial biopsy 3. Normal pelvic ultrasound 4. Patient is a candidate for endometrial ablation or medical therapy for management of abnormal uterine bleeding.  PLAN: 1. Options of management were reviewed including Depo-Provera every 3 months, versus daily Provera days 1 through 10 each month, versus hysteroscopy/D&C with NovaSure endometrial ablation. 2. Literature regarding surgery is given. 3. Patient will contact us when she decides which management option she chooses.  A total of 15 minutes were spent face-to-face with the  patient during this encounter and over half of that time dealt with counseling and coordination of care.  Herold Harms, MD  Note: This dictation was prepared with Dragon dictation along with smaller phrase technology. Any transcriptional errors that result from this process are unintentional.

## 2016-11-11 ENCOUNTER — Telehealth: Payer: Self-pay | Admitting: Obstetrics and Gynecology

## 2016-11-11 MED ORDER — MEDROXYPROGESTERONE ACETATE 150 MG/ML IM SUSP: 150.0000 mg | INTRAMUSCULAR | 1 refills | Status: DC

## 2016-11-11 NOTE — Telephone Encounter (Signed)
Pt has decided on depo provera. Med erx. Appt made for 11/19/2016 for AE and 1st depo inj.

## 2016-11-11 NOTE — Telephone Encounter (Signed)
Patient called stating she has decided on the Lupron Depo vs surgery. Thanks

## 2016-11-19 ENCOUNTER — Encounter: Payer: Self-pay | Admitting: Obstetrics and Gynecology

## 2016-11-19 ENCOUNTER — Ambulatory Visit (INDEPENDENT_AMBULATORY_CARE_PROVIDER_SITE_OTHER): Payer: Medicaid Other | Admitting: Obstetrics and Gynecology

## 2016-11-19 VITALS — BP 111/76 | HR 79 | Ht 65.0 in | Wt 150.5 lb

## 2016-11-19 DIAGNOSIS — Z98891 History of uterine scar from previous surgery: Secondary | ICD-10-CM | POA: Diagnosis not present

## 2016-11-19 DIAGNOSIS — Z9851 Tubal ligation status: Secondary | ICD-10-CM

## 2016-11-19 DIAGNOSIS — Z720 Tobacco use: Secondary | ICD-10-CM

## 2016-11-19 DIAGNOSIS — Z1239 Encounter for other screening for malignant neoplasm of breast: Secondary | ICD-10-CM

## 2016-11-19 DIAGNOSIS — Z87410 Personal history of cervical dysplasia: Secondary | ICD-10-CM | POA: Diagnosis not present

## 2016-11-19 DIAGNOSIS — Z9889 Other specified postprocedural states: Secondary | ICD-10-CM

## 2016-11-19 DIAGNOSIS — Z01419 Encounter for gynecological examination (general) (routine) without abnormal findings: Secondary | ICD-10-CM

## 2016-11-19 DIAGNOSIS — Z1231 Encounter for screening mammogram for malignant neoplasm of breast: Secondary | ICD-10-CM | POA: Diagnosis not present

## 2016-11-19 MED ORDER — MEDROXYPROGESTERONE ACETATE 150 MG/ML IM SUSP
150.0000 mg | Freq: Once | INTRAMUSCULAR | Status: AC
  Administered 2016-11-19: 150 mg via INTRAMUSCULAR

## 2016-11-19 NOTE — Patient Instructions (Addendum)
1. Pap smear is done 2. Mammogram is ordered 3. Screening labs are to be obtained through primary care 4. Continue with healthy eating and exercise 5. Smoking cessation encouraged 6. Contraception-Depo-Provera 150 mg IM every 3 months 7. Return in 1 year for annual exam  Health Maintenance, Female Adopting a healthy lifestyle and getting preventive care can go a long way to promote health and wellness. Talk with your health care provider about what schedule of regular examinations is right for you. This is a good chance for you to check in with your provider about disease prevention and staying healthy. In between checkups, there are plenty of things you can do on your own. Experts have done a lot of research about which lifestyle changes and preventive measures are most likely to keep you healthy. Ask your health care provider for more information. Weight and diet Eat a healthy diet  Be sure to include plenty of vegetables, fruits, low-fat dairy products, and lean protein.  Do not eat a lot of foods high in solid fats, added sugars, or salt.  Get regular exercise. This is one of the most important things you can do for your health. ? Most adults should exercise for at least 150 minutes each week. The exercise should increase your heart rate and make you sweat (moderate-intensity exercise). ? Most adults should also do strengthening exercises at least twice a week. This is in addition to the moderate-intensity exercise.  Maintain a healthy weight  Body mass index (BMI) is a measurement that can be used to identify possible weight problems. It estimates body fat based on height and weight. Your health care provider can help determine your BMI and help you achieve or maintain a healthy weight.  For females 45 years of age and older: ? A BMI below 18.5 is considered underweight. ? A BMI of 18.5 to 24.9 is normal. ? A BMI of 25 to 29.9 is considered overweight. ? A BMI of 30 and above is  considered obese.  Watch levels of cholesterol and blood lipids  You should start having your blood tested for lipids and cholesterol at 44 years of age, then have this test every 5 years.  You may need to have your cholesterol levels checked more often if: ? Your lipid or cholesterol levels are high. ? You are older than 44 years of age. ? You are at high risk for heart disease.  Cancer screening Lung Cancer  Lung cancer screening is recommended for adults 2-53 years old who are at high risk for lung cancer because of a history of smoking.  A yearly low-dose CT scan of the lungs is recommended for people who: ? Currently smoke. ? Have quit within the past 15 years. ? Have at least a 30-pack-year history of smoking. A pack year is smoking an average of one pack of cigarettes a day for 1 year.  Yearly screening should continue until it has been 15 years since you quit.  Yearly screening should stop if you develop a health problem that would prevent you from having lung cancer treatment.  Breast Cancer  Practice breast self-awareness. This means understanding how your breasts normally appear and feel.  It also means doing regular breast self-exams. Let your health care provider know about any changes, no matter how small.  If you are in your 20s or 30s, you should have a clinical breast exam (CBE) by a health care provider every 1-3 years as part of a regular health exam.  If you are 40 or older, have a CBE every year. Also consider having a breast X-ray (mammogram) every year.  If you have a family history of breast cancer, talk to your health care provider about genetic screening.  If you are at high risk for breast cancer, talk to your health care provider about having an MRI and a mammogram every year.  Breast cancer gene (BRCA) assessment is recommended for women who have family members with BRCA-related cancers. BRCA-related cancers  include: ? Breast. ? Ovarian. ? Tubal. ? Peritoneal cancers.  Results of the assessment will determine the need for genetic counseling and BRCA1 and BRCA2 testing.  Cervical Cancer Your health care provider may recommend that you be screened regularly for cancer of the pelvic organs (ovaries, uterus, and vagina). This screening involves a pelvic examination, including checking for microscopic changes to the surface of your cervix (Pap test). You may be encouraged to have this screening done every 3 years, beginning at age 21.  For women ages 30-65, health care providers may recommend pelvic exams and Pap testing every 3 years, or they may recommend the Pap and pelvic exam, combined with testing for human papilloma virus (HPV), every 5 years. Some types of HPV increase your risk of cervical cancer. Testing for HPV may also be done on women of any age with unclear Pap test results.  Other health care providers may not recommend any screening for nonpregnant women who are considered low risk for pelvic cancer and who do not have symptoms. Ask your health care provider if a screening pelvic exam is right for you.  If you have had past treatment for cervical cancer or a condition that could lead to cancer, you need Pap tests and screening for cancer for at least 20 years after your treatment. If Pap tests have been discontinued, your risk factors (such as having a new sexual partner) need to be reassessed to determine if screening should resume. Some women have medical problems that increase the chance of getting cervical cancer. In these cases, your health care provider may recommend more frequent screening and Pap tests.  Colorectal Cancer  This type of cancer can be detected and often prevented.  Routine colorectal cancer screening usually begins at 44 years of age and continues through 44 years of age.  Your health care provider may recommend screening at an earlier age if you have risk factors  for colon cancer.  Your health care provider may also recommend using home test kits to check for hidden blood in the stool.  A small camera at the end of a tube can be used to examine your colon directly (sigmoidoscopy or colonoscopy). This is done to check for the earliest forms of colorectal cancer.  Routine screening usually begins at age 50.  Direct examination of the colon should be repeated every 5-10 years through 44 years of age. However, you may need to be screened more often if early forms of precancerous polyps or small growths are found.  Skin Cancer  Check your skin from head to toe regularly.  Tell your health care provider about any new moles or changes in moles, especially if there is a change in a mole's shape or color.  Also tell your health care provider if you have a mole that is larger than the size of a pencil eraser.  Always use sunscreen. Apply sunscreen liberally and repeatedly throughout the day.  Protect yourself by wearing long sleeves, pants, a wide-brimmed hat, and   sunglasses whenever you are outside.  Heart disease, diabetes, and high blood pressure  High blood pressure causes heart disease and increases the risk of stroke. High blood pressure is more likely to develop in: ? People who have blood pressure in the high end of the normal range (130-139/85-89 mm Hg). ? People who are overweight or obese. ? People who are African American.  If you are 25-70 years of age, have your blood pressure checked every 3-5 years. If you are 86 years of age or older, have your blood pressure checked every year. You should have your blood pressure measured twice-once when you are at a hospital or clinic, and once when you are not at a hospital or clinic. Record the average of the two measurements. To check your blood pressure when you are not at a hospital or clinic, you can use: ? An automated blood pressure machine at a pharmacy. ? A home blood pressure monitor.  If  you are between 34 years and 33 years old, ask your health care provider if you should take aspirin to prevent strokes.  Have regular diabetes screenings. This involves taking a blood sample to check your fasting blood sugar level. ? If you are at a normal weight and have a low risk for diabetes, have this test once every three years after 44 years of age. ? If you are overweight and have a high risk for diabetes, consider being tested at a younger age or more often. Preventing infection Hepatitis B  If you have a higher risk for hepatitis B, you should be screened for this virus. You are considered at high risk for hepatitis B if: ? You were born in a country where hepatitis B is common. Ask your health care provider which countries are considered high risk. ? Your parents were born in a high-risk country, and you have not been immunized against hepatitis B (hepatitis B vaccine). ? You have HIV or AIDS. ? You use needles to inject street drugs. ? You live with someone who has hepatitis B. ? You have had sex with someone who has hepatitis B. ? You get hemodialysis treatment. ? You take certain medicines for conditions, including cancer, organ transplantation, and autoimmune conditions.  Hepatitis C  Blood testing is recommended for: ? Everyone born from [birth year] through 1963. ? Anyone with known risk factors for hepatitis C.  Sexually transmitted infections (STIs)  You should be screened for sexually transmitted infections (STIs) including gonorrhea and chlamydia if: ? You are sexually active and are younger than 44 years of age. ? You are older than 44 years of age and your health care provider tells you that you are at risk for this type of infection. ? Your sexual activity has changed since you were last screened and you are at an increased risk for chlamydia or gonorrhea. Ask your health care provider if you are at risk.  If you do not have HIV, but are at risk, it may be recommended  that you take a prescription medicine daily to prevent HIV infection. This is called pre-exposure prophylaxis (PrEP). You are considered at risk if: ? You are sexually active and do not regularly use condoms or know the HIV status of your partner(s). ? You take drugs by injection. ? You are sexually active with a partner who has HIV.  Talk with your health care provider about whether you are at high risk of being infected with HIV. If you choose to begin PrEP, you  should first be tested for HIV. You should then be tested every 3 months for as long as you are taking PrEP. Pregnancy  If you are premenopausal and you may become pregnant, ask your health care provider about preconception counseling.  If you may become pregnant, take 400 to 800 micrograms (mcg) of folic acid every day.  If you want to prevent pregnancy, talk to your health care provider about birth control (contraception). Osteoporosis and menopause  Osteoporosis is a disease in which the bones lose minerals and strength with aging. This can result in serious bone fractures. Your risk for osteoporosis can be identified using a bone density scan.  If you are 65 years of age or older, or if you are at risk for osteoporosis and fractures, ask your health care provider if you should be screened.  Ask your health care provider whether you should take a calcium or vitamin D supplement to lower your risk for osteoporosis.  Menopause may have certain physical symptoms and risks.  Hormone replacement therapy may reduce some of these symptoms and risks. Talk to your health care provider about whether hormone replacement therapy is right for you. Follow these instructions at home:  Schedule regular health, dental, and eye exams.  Stay current with your immunizations.  Do not use any tobacco products including cigarettes, chewing tobacco, or electronic cigarettes.  If you are pregnant, do not drink alcohol.  If you are  breastfeeding, limit how much and how often you drink alcohol.  Limit alcohol intake to no more than 1 drink per day for nonpregnant women. One drink equals 12 ounces of beer, 5 ounces of wine, or 1 ounces of hard liquor.  Do not use street drugs.  Do not share needles.  Ask your health care provider for help if you need support or information about quitting drugs.  Tell your health care provider if you often feel depressed.  Tell your health care provider if you have ever been abused or do not feel safe at home. This information is not intended to replace advice given to you by your health care provider. Make sure you discuss any questions you have with your health care provider. Document Released: 08/19/2010 Document Revised: 07/12/2015 Document Reviewed: 11/07/2014 Elsevier Interactive Patient Education  2018 [employer]  

## 2016-11-19 NOTE — Addendum Note (Signed)
Addended by: Marchelle Folks on: 11/19/2016 03:12 PM   Modules accepted: Orders

## 2016-11-19 NOTE — Progress Notes (Signed)
ANNUAL PREVENTATIVE CARE GYN  ENCOUNTER NOTE  Subjective:       Theresa Ryan is a 46 y.o. [phone] female s/p tubal ligation here for a routine annual gynecologic exam.  Current complaints: 1. Abnormal uterine bleeding  Endorses history of irregular cycles, was recently seen in office on 11/05/2016 for issue. Endometrial biopsy performed on 10/24/2016 showed no hyperplasia with benign endometrial fragments. Ultrasound report from 10/28/2016 showed a thickened endometrium (5.1 mm) with no fibroids or other abnormalities. LMP was 11/12/2016, and the previous cycle was 10/20/2016. Reports breakthrough bleeding and heavy cycles. At last visit on 09/19, was given the option of beginning 1) Depo-Provera every 3 months, 2) daily Provera days 1-10 every month or 3) endometrial ablation.   Pt wishes to begin Depo-Provera today. States she was previously taking this medication for years. Reports she experienced mild weight gain in the past, but she is now at a lower BMI than she was in the past. Currently walking every day for exercise and watching dietary intake.   Pt has not had PAP smear, pelvic exams or mammograms in the past three years due to lack of insurance.    Gynecologic History Patient's last menstrual period was 11/12/2016 (exact date). Contraception: tubal ligation Last Pap: 2015 wnl. Results were: normal Last mammogram: many years ago. Results were: normal  Obstetric History OB History  Gravida Para Term Preterm AB Living  SAB TAB Ectopic Multiple Live Births    2     3    # Outcome Date GA Lbr Len/2nd Weight Sex Delivery Anes PTL Lv  6 Preterm 2006        FD  5 Preterm 2000     CS-LTranv   LIV     Complications: Abruptio Placenta  4 TAB 1998          3 Term 60    F Vag-Spont   LIV  2 TAB 1991          1 Term 53    F Vag-Spont   LIV      Past Medical History:  Diagnosis Date  . Abnormal Pap smear of cervix   . DDD (degenerative disc disease),  cervical     Past Surgical History:  Procedure Laterality Date  . BACK SURGERY    . CERVICAL BIOPSY  W/ LOOP ELECTRODE EXCISION    . CESAREAN SECTION    . CHOLECYSTECTOMY    . CRYOTHERAPY    . DILATION AND CURETTAGE OF UTERUS    . LAPAROSCOPY    . TUBAL LIGATION  2006    Current Outpatient Prescriptions on File Prior to Visit  Medication Sig Dispense Refill  . medroxyPROGESTERone (DEPO-PROVERA) 150 MG/ML injection Inject 1 mL (150 mg total) into the muscle every 3 (three) months. 1 mL 1   No current facility-administered medications on file prior to visit.     Allergies  Allergen Reactions  . Vicoprofen [Hydrocodone-Ibuprofen] Other (See Comments)    Chest pains    Social History   Social History  . Marital status: Single    Spouse name: N/A  . Number of children: N/A  . Years of education: N/A   Occupational History  . Not on file.   Social History Main Topics  . Smoking status: Current Every Day Smoker    Packs/day: 0.50    Types: Cigarettes  . Smokeless tobacco: Never Used  . Alcohol use No  . Drug use: No  .  Sexual activity: Yes    Birth control/ protection: Surgical   Other Topics Concern  . Not on file   Social History Narrative  . No narrative on file    Family History  Problem Relation Age of Onset  . Diabetes Mother   . Colon cancer Maternal Grandmother   . Diabetes Daughter   . Breast cancer Neg Hx   . Ovarian cancer Neg Hx     The following portions of the patient's history were reviewed and updated as appropriate: allergies, current medications, past family history, past medical history, past social history, past surgical history and problem list.  Review of Systems Review of Systems  Constitutional: Negative for chills, fever and malaise/fatigue.  HENT: Negative for ear pain, hearing loss, sore throat and tinnitus.   Eyes: Negative for blurred vision and pain.  Respiratory: Negative for cough, shortness of breath and wheezing.    Cardiovascular: Negative for chest pain, palpitations, claudication and leg swelling.  Gastrointestinal: Negative for abdominal pain, constipation, diarrhea, nausea and vomiting.  Genitourinary: Negative for dysuria, frequency and urgency.  Musculoskeletal: Negative for myalgias.  Skin: Negative for rash.  Neurological: Negative for dizziness, loss of consciousness, weakness and headaches.      Objective:   BP 111/76   Pulse 79   Ht  (1.651 m)   Wt 150 lb 8 oz (68.3 kg)   LMP 11/12/2016 (Exact Date)   BMI 25.04 kg/m  CONSTITUTIONAL: Well-developed, well-nourished female in no acute distress.  PSYCHIATRIC: Normal mood and affect. Normal behavior. Normal judgment and thought content. NEUROLGIC: Alert and oriented to person, place, and time. Normal muscle tone coordination. No cranial nerve deficit noted. HENT:  Normocephalic, atraumatic, External right and left ear normal. Oropharynx is clear and moist EYES: Conjunctivae and EOM are normal. Pupils are equal, round, and reactive to light. No scleral icterus.  NECK: Normal range of motion, supple, no masses.  Normal thyroid.  SKIN: Skin is warm and dry. No rash noted. Not diaphoretic. No erythema. No pallor. CARDIOVASCULAR: Normal heart rate noted, regular rhythm, no murmur. RESPIRATORY: Clear to auscultation bilaterally. Effort and breath sounds normal, no problems with respiration noted. BREASTS: Symmetric in size. No masses, skin changes, nipple drainage, or lymphadenopathy. ABDOMEN: Soft, normal bowel sounds, no distention noted.  No tenderness, rebound or guarding.  BLADDER: Normal PELVIC:  External Genitalia: Normal  BUS: Normal  Vagina: Normal  Cervix: Normal; parous; slightly deviated to the right; no cervical motion tenderness  Uterus: Normal; anteverted, normal size and shape, mobile, nontender  Adnexa: Normal; nonpalpable nontender  RV: External Exam NormaI, No Rectal Masses and Normal Sphincter tone   MUSCULOSKELETAL: Normal range of motion. No tenderness.  No cyanosis, clubbing, or edema.  2+ DP/PT pulses. LYMPHATIC: No Axillary, Supraclavicular, cervical or Inguinal Adenopathy.    Assessment:   Annual gynecologic examination 46 y.o. Contraception: tubal ligation Normal BMI Abnormal uterine bleeding; benign endometrial biopsy and normal ultrasound  Plan:  Pap: Pap Co Test Mammogram: Ordered Stool Guaiac Testing:  Not Indicated Labs: thru pcp Routine preventative health maintenance measures emphasized: Exercise/Diet/Weight control, Tobacco Warnings and Alcohol/Substance use risks Provera 150 mg IM every 3 months to regulate abnormal uterine bleeding Smoking cessation strongly encouraged Return to Clinic - 1 Year   Crystal Wrens, CMA [employer], PA-S Herold Harms, MD   I have seen, interviewed, and examined the patient in conjunction with the [employer].A. student and affirm the diagnosis and management plan. Kamron Portee A. Quency Tober, MD, Evern Core  Note: This dictation was prepared with Dragon dictation along with smaller phrase technology. Any transcriptional errors that result from this process are unintentional.

## 2016-11-21 LAB — IGP, COBASHPV16/18
HPV 16: NEGATIVE
HPV 18: NEGATIVE
HPV OTHER HR TYPES: POSITIVE — AB
PAP Smear Comment: 0

## 2016-11-25 ENCOUNTER — Telehealth: Payer: Self-pay | Admitting: Obstetrics and Gynecology

## 2016-11-25 NOTE — Telephone Encounter (Signed)
Pt aware pap results. Pos trich and need for confirmation. Appt made for 10/10 1:30.

## 2016-11-25 NOTE — Telephone Encounter (Signed)
Patient called with concerns regarding her pap results. Thanks

## 2016-11-26 ENCOUNTER — Ambulatory Visit (INDEPENDENT_AMBULATORY_CARE_PROVIDER_SITE_OTHER): Payer: Medicaid Other | Admitting: Obstetrics and Gynecology

## 2016-11-26 ENCOUNTER — Encounter: Payer: Self-pay | Admitting: Obstetrics and Gynecology

## 2016-11-26 VITALS — BP 108/68 | HR 81 | Ht 65.0 in | Wt 149.6 lb

## 2016-11-26 DIAGNOSIS — A5901 Trichomonal vulvovaginitis: Secondary | ICD-10-CM | POA: Insufficient documentation

## 2016-11-26 DIAGNOSIS — Z202 Contact with and (suspected) exposure to infections with a predominantly sexual mode of transmission: Secondary | ICD-10-CM

## 2016-11-26 MED ORDER — METRONIDAZOLE 500 MG PO TABS: 500.0000 mg | ORAL_TABLET | Freq: Two times a day (BID) | ORAL | 0 refills | Status: AC

## 2016-11-26 NOTE — Patient Instructions (Signed)
1. Wet prep today shows Trichomonas vaginalis 2. STD screening is performed today with the exception of HIV which has already been completed elsewhere. 3. Metronidazole 500 mg twice a day for 7 days is prescribed   Cervicitis Cervicitis is irritation and swelling of the cervix. The cervix is the lower and narrow end of the uterus. It is the part of the uterus that opens up to the vagina. What are the causes? This condition may be caused by:  An STI (sexually transmitted infection), such as gonorrhea, chlamydia, or genital herpes.  Objects that are put in the vagina, such as tampons or birth control devices. This usually occurs if an object is left in for too long.  Chemical irritation or allergic reaction. This may be from vaginal douches, latex condoms, or contraceptive creams.  An injury to the cervix.  A bacterial infection.  Radiation therapy.  What increases the risk? You are more likely to develop this condition if:  You have unprotected sex.  You have sex with many partners.  You have a new sexual partner.  You start having sex at an early age.  You have a history of STIs.  What are the signs or symptoms? Symptoms of this condition include:  Wallace Cullens, white, yellow, or bad-smelling vaginal discharge.  Pain or itchiness around the vagina.  Pain during sex.  Pain in the lower abdomen or lower back, especially during sex.  Urinating often.  Pain during urination.  Abnormal vaginal bleeding, such as bleeding between periods, after sex, or after menopause.  In some cases, there are no symptoms. How is this diagnosed? This condition may be diagnosed with:  A pelvic exam. Your health care provider will examine whether the cervix has an unusual discharge or bleeds easily when touched with a swab.  A wet prep. This is a test in which vaginal discharge is examined under a microscope to check for signs of infection.  A swab test of the cervix. For this test, sample  cells from the cervix are collected on a swab and examined under a microscope to check for signs of infection.  Urine tests.  How is this treated? Treatment for cervicitis depends on what is causing the condition. Treatment may include:  Antibiotic medicines. These are used to treat certain infections, including STIs like gonorrhea or chlamydia. If you are taking these medicines to treat an STI, your sexual partner may also need to take these medicines.  Antiviral medicines. These are used to treat herpes simplex virus. Your sexual partner may also need to take these medicines.  Stopping use of items that cause irritation, such as tampons, latex condoms, douches, or spermicides.  Follow these instructions at home:  Do not have sex until your health care provider says it is okay.  Take over-the-counter and prescription medicines only as told by your health care provider.  If you were prescribed an antibiotic, take it as told by your health care provider. Do not stop taking the antibiotic even if you start to feel better.  Keep all follow-up visits as told by your health care provider. This is important. Contact a health care provider if:  Your symptoms come back or get worse after treatment.  You have a fever.  You have fatigue.  You have pain in your abdomen.  You experience nausea, vomiting, or diarrhea.  You have back pain. Get help right away if:  You have severe abdominal pain that cannot be helped with medicine.  You cannot urinate. Summary  Cervicitis is irritation and swelling of the cervix.  This condition may be caused by an STI (sexually transmitted infection), an allergic reaction or chemical irritation, radiation therapy, or objects that are put in the vagina, such as tampons or diaphragms.  Symptoms of this condition can include unusual vaginal discharge, painful urination, irritation or pain around the vagina, bleeding between periods or after sex, and pain  during sex.  You are more likely to develop this condition if you have unprotected sex, have many sexual partners, or have a history of STIs.  This condition may be treated with antibiotic or antiviral medicines or by stopping use of items that cause irritation. This information is not intended to replace advice given to you by your health care provider. Make sure you discuss any questions you have with your health care provider. Document Released: 02/03/2005 Document Revised: 10/20/2015 Document Reviewed: 10/20/2015 Elsevier Interactive Patient Education  2017 [employer].

## 2016-11-26 NOTE — Progress Notes (Signed)
Chief complaint: 1. Trichomonas on Pap smear  Theresa Ryan presents today for confirmation of Trichomonas vaginitis infection. She has been having ongoing abnormal uterine bleeding in recent months. She does not report any significant vaginal discharge. She has been monogamous; she is no longer with her partner. She has not had recent STD screening except for HIV.  Past medical history, surgical history, problem list, medications, and allergies are reviewed  OBJECTIVE: BP 108/68   Pulse 81   Ht  (1.651 m)   Wt 149 lb 9.6 oz (67.9 kg)   LMP 11/12/2016 (Exact Date)   BMI 24.89 kg/m  Pleasant female in no acute distress. Pelvic exam: External genitalia-normal BUS-normal Vagina-minimal gray white secretions in the vaginal vault; wet prep and Nuswab are obtained Cervix-no significant discharge or lesions Uterus-not examined  PROCEDURE: Wet prep  Normal saline-moderate white blood cells; Trichomonas present; epithelial cells present; no clue cells  KOH-no yeast  ASSESSMENT: 1. Trichomonas vaginalis infection 2. Needs STI screening  PLAN: 1. Nu swab plus is obtained 2. STD screen, blood work, obtained (exception of HIV which was recently done) 3. Metronidazole 500 mg twice a day for 7 days 4. Patient is aware partner needs to be treated in order to prevent further infection/reinfection 5. Return as needed  A total of 15 minutes were spent face-to-face with the patient during this encounter and over half of that time dealt with counseling and coordination of care.  Herold Harms, MD  Note: This dictation was prepared with Dragon dictation along with smaller phrase technology. Any transcriptional errors that result from this process are unintentional.

## 2016-11-26 NOTE — Addendum Note (Signed)
Addended by: Marchelle Folks on: 11/26/2016 02:59 PM   Modules accepted: Orders

## 2016-11-27 ENCOUNTER — Encounter: Payer: Self-pay | Admitting: Obstetrics and Gynecology

## 2016-11-27 ENCOUNTER — Telehealth: Payer: Self-pay | Admitting: Obstetrics and Gynecology

## 2016-11-27 DIAGNOSIS — Z1159 Encounter for screening for other viral diseases: Secondary | ICD-10-CM

## 2016-11-27 DIAGNOSIS — R768 Other specified abnormal immunological findings in serum: Secondary | ICD-10-CM

## 2016-11-27 DIAGNOSIS — R7689 Other specified abnormal immunological findings in serum: Secondary | ICD-10-CM | POA: Insufficient documentation

## 2016-11-27 DIAGNOSIS — B009 Herpesviral infection, unspecified: Secondary | ICD-10-CM | POA: Insufficient documentation

## 2016-11-27 HISTORY — DX: Other specified abnormal immunological findings in serum: R76.8

## 2016-11-27 LAB — HEPATITIS B SURFACE ANTIGEN: Hepatitis B Surface Ag: NEGATIVE

## 2016-11-27 LAB — HSV(HERPES SIMPLEX VRS) I + II AB-IGG
HSV 1 Glycoprotein G Ab, IgG: 6.4 index — ABNORMAL HIGH (ref 0.00–0.90)
HSV 2 IgG, Type Spec: 3.91 index — ABNORMAL HIGH (ref 0.00–0.90)

## 2016-11-27 LAB — HSV-2 IGG SUPPLEMENTAL TEST: HSV-2 IgG Supplemental Test: POSITIVE — AB

## 2016-11-27 LAB — HEPATITIS C ANTIBODY: Hep C Virus Ab: <0.1 s/co ratio (ref 0.0–0.9)

## 2016-11-27 LAB — RPR: RPR Ser Ql: NONREACTIVE

## 2016-11-27 NOTE — Telephone Encounter (Signed)
Patient called and stated that she is not able to understand her MyChart results. The patient would like for [employer] to call her as soon as she can. Please advise.

## 2016-11-27 NOTE — Telephone Encounter (Signed)
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 Pt aware if she develops a lesion she will need to be seen for treatment.

## 2016-12-02 ENCOUNTER — Telehealth: Payer: Self-pay | Admitting: Obstetrics and Gynecology

## 2016-12-02 LAB — NUSWAB VAGINITIS PLUS (VG+)
ATOPOBIUM VAGINAE: HIGH {score} — AB
BVAB 2: HIGH {score} — AB
Candida albicans, NAA: NEGATIVE
Candida glabrata, NAA: NEGATIVE
Chlamydia trachomatis, NAA: NEGATIVE
Megasphaera 1: HIGH Score — AB
Neisseria gonorrhoeae, NAA: NEGATIVE
Trich vag by NAA: POSITIVE — AB

## 2016-12-02 NOTE — Telephone Encounter (Signed)
Pt aware flagyl will treat bv and trich.

## 2016-12-02 NOTE — Telephone Encounter (Signed)
Patient has some questions about her test results that are on [facility]   Please call

## 2016-12-08 ENCOUNTER — Other Ambulatory Visit: Payer: Self-pay

## 2016-12-08 ENCOUNTER — Telehealth: Payer: Self-pay | Admitting: Obstetrics and Gynecology

## 2016-12-08 MED ORDER — NYSTATIN 100000 UNIT/GM EX OINT: 1.0000 "application " | TOPICAL_OINTMENT | Freq: Two times a day (BID) | CUTANEOUS | 0 refills | Status: DC

## 2016-12-08 MED ORDER — NYSTATIN-TRIAMCINOLONE 100000-0.1 UNIT/GM-% EX CREA: 1.0000 "application " | TOPICAL_CREAM | Freq: Two times a day (BID) | CUTANEOUS | 0 refills | Status: DC

## 2016-12-08 MED ORDER — FLUCONAZOLE 150 MG PO TABS: 150.0000 mg | ORAL_TABLET | Freq: Every day | ORAL | 0 refills | Status: DC

## 2016-12-08 MED ORDER — TRIAMCINOLONE ACETONIDE 0.1 % EX OINT: 1.0000 "application " | TOPICAL_OINTMENT | Freq: Two times a day (BID) | CUTANEOUS | 0 refills | Status: DC

## 2016-12-08 NOTE — Telephone Encounter (Signed)
Pt states she has completed both ATB. Now has external itching. No d/c. Only slight bleeding when she wipes. Did restart depo several weeks ago. No new body wash or detergent. Diflucan and nystatin triamcinolone erx. Pt aware if no better in 7-10 days she will need an appt.

## 2016-12-08 NOTE — Telephone Encounter (Signed)
The patient called and stated that she has a few questions for [employer] Miller in regards to her medication she is currently taking. No other information was disclosed. Please advise.

## 2016-12-11 ENCOUNTER — Ambulatory Visit
Admission: RE | Admit: 2016-12-11 | Discharge: 2016-12-11 | Disposition: A | Discharge status: Home / Self Care | Payer: Medicaid Other | Source: Ambulatory Visit | Attending: Obstetrics and Gynecology | Admitting: Obstetrics and Gynecology

## 2016-12-11 DIAGNOSIS — Z1231 Encounter for screening mammogram for malignant neoplasm of breast: Secondary | ICD-10-CM | POA: Diagnosis present

## 2016-12-11 DIAGNOSIS — Z1239 Encounter for other screening for malignant neoplasm of breast: Secondary | ICD-10-CM

## 2017-02-05 ENCOUNTER — Telehealth: Payer: Self-pay | Admitting: *Deleted

## 2017-02-05 NOTE — Telephone Encounter (Signed)
Patient called states she has an appt tomorrow for a depo injection. She wants to discuss that appt to see if she is needing to come or not. Patient is requesting a call back. Her call back number is [phone]. Please advise. Thank you

## 2017-02-05 NOTE — Telephone Encounter (Signed)
Pt had depo inj on 10/3- Since starting depo she has had h/a, Chest pain that took her to ed(normal findings), Aub- at times heavy then only spotting, irritable, down, and depressed. At this time she would like to d/c depo. Pt advised to keep menstrual calendar. If bleeding for longer than 7 days, spotting in between periods, or soaking a pad q 30 minutes to contact office for an appt. Pt voices understanding.

## 2017-02-06 ENCOUNTER — Ambulatory Visit: Payer: Medicaid Other

## 2017-04-03 ENCOUNTER — Telehealth: Payer: Self-pay | Admitting: Obstetrics and Gynecology

## 2017-04-03 NOTE — Telephone Encounter (Signed)
The patient called and stated that she needs to speak with Theresa Ryan in regards to her last appointment. No other information was disclosed. Please advise.

## 2017-04-06 NOTE — Telephone Encounter (Signed)
Pt states that since her last depo she has had lite bleeding and or spotting on and off. Advised mvi. Appt made for 2/27 at 2:45. Pt aware if soaking a pad q30 minutes she will need to be seen at ED.

## 2017-04-15 ENCOUNTER — Encounter: Payer: Self-pay | Admitting: Obstetrics and Gynecology

## 2017-04-15 ENCOUNTER — Ambulatory Visit (INDEPENDENT_AMBULATORY_CARE_PROVIDER_SITE_OTHER): Payer: Medicaid Other | Admitting: Obstetrics and Gynecology

## 2017-04-15 VITALS — BP 123/84 | HR 85 | Ht 65.0 in | Wt 146.5 lb

## 2017-04-15 DIAGNOSIS — Z720 Tobacco use: Secondary | ICD-10-CM

## 2017-04-15 DIAGNOSIS — N939 Abnormal uterine and vaginal bleeding, unspecified: Secondary | ICD-10-CM

## 2017-04-15 NOTE — Progress Notes (Signed)
Chief complaint: 1.  Abnormal uterine bleeding  Theresa Ryan presents today for follow-up. Last Depo-Provera shot which was given for abnormal uterine bleeding was October 2018; she has been chronically spotting and bleeding since that last Depo the Provera injection.  She is feeling chronically fatigued.  She is interested in a more aggressive management of this problem. Theresa Ryan smokes tobacco. She is a not an OCP candidate. Endometrial biopsy in September 2018 was benign Pelvic ultrasound in September 2018 was normal with no evidence of uterine pathology.  Past medical history, past surgical history, problem list, medications, and allergies are reviewed  OBJECTIVE: BP 123/84   Pulse 85   Ht 5\' 5"  (1.651 m)   Wt 146 lb 8 oz (66.5 kg)   LMP  (LMP Unknown) Comment: spotting since depo in 11/2016  BMI 24.38 kg/m  Pleasant well-appearing female in no acute distress.  Alert and oriented. Abdomen: Soft, nontender Pelvic exam: Gynecoid External genitalia-normal BUS-normal Vagina-minimal menstrual blood in the vault; no lesions Cervix-parous; no lesions; no cervical motion tenderness Uterus-midplane, mobile, normal size and shape, nontender Adnexa-nonpalpable nontender Rectovaginal-normal external exam  ASSESSMENT: 1.  Chronic abnormal uterine bleeding refractory to medical therapy 2.  History of tubal ligation 3.  Normal pelvic ultrasound and normal endometrial biopsy in September 2018  PLAN: 1.  Hysteroscopy/D&C with NovaSure endometrial ablation 2.  Should persistent abnormal uterine bleeding occur, then hysterectomy will be offered.  Patient is an excellent transvaginal hysterectomy candidate. 3.  All medical and surgical options were reviewed with the patient with multiple questions answered.  A total of 25 minutes were spent face-to-face with the patient during this encounter and over half of that time involved counseling and coordination of care.  Herold HarmsMartin A Noeli Lavery,  MD  Note: This dictation was prepared with Dragon dictation along with smaller phrase technology. Any transcriptional errors that result from this process are unintentional.

## 2017-04-15 NOTE — Patient Instructions (Signed)
1.  Hysteroscopy/D&C with NovaSure endometrial ablation is scheduled 2.  Return 1 week prior to surgery for preop appointment   Endometrial Ablation Endometrial ablation is a procedure that destroys the thin inner layer of the lining of the uterus (endometrium). This procedure may be done:  To stop heavy periods.  To stop bleeding that is causing anemia.  To control irregular bleeding.  To treat bleeding caused by small tumors (fibroids) in the endometrium.  This procedure is often an alternative to major surgery, such as removal of the uterus and cervix (hysterectomy). As a result of this procedure:  You may not be able to have children. However, if you are premenopausal (you have not gone through menopause): ? You may still have a small chance of getting pregnant. ? You will need to use a reliable method of birth control after the procedure to prevent pregnancy.  You may stop having a menstrual period, or you may have only a small amount of bleeding during your period. Menstruation may return several years after the procedure.  Tell a health care provider about:  Any allergies you have.  All medicines you are taking, including vitamins, herbs, eye drops, creams, and over-the-counter medicines.  Any problems you or family members have had with the use of anesthetic medicines.  Any blood disorders you have.  Any surgeries you have had.  Any medical conditions you have. What are the risks? Generally, this is a safe procedure. However, problems may occur, including:  A hole (perforation) in the uterus or bowel.  Infection of the uterus, bladder, or vagina.  Bleeding.  Damage to other structures or organs.  An air bubble in the lung (air embolus).  Problems with pregnancy after the procedure.  Failure of the procedure.  Decreased ability to diagnose cancer in the endometrium.  What happens before the procedure?  You will have tests of your endometrium to make  sure there are no pre-cancerous cells or cancer cells present.  You may have an ultrasound of the uterus.  You may be given medicines to thin the endometrium.  Ask your health care provider about: ? Changing or stopping your regular medicines. This is especially important if you take diabetes medicines or blood thinners. ? Taking medicines such as aspirin and ibuprofen. These medicines can thin your blood. Do not take these medicines before your procedure if your doctor tells you not to.  Plan to have someone take you home from the hospital or clinic. What happens during the procedure?  You will lie on an exam table with your feet and legs supported as in a pelvic exam.  To lower your risk of infection: ? Your health care team will wash or sanitize their hands and put on germ-free (sterile) gloves. ? Your genital area will be washed with soap.  An IV tube will be inserted into one of your veins.  You will be given a medicine to help you relax (sedative).  A surgical instrument with a light and camera (resectoscope) will be inserted into your vagina and moved into your uterus. This allows your surgeon to see inside your uterus.  Endometrial tissue will be removed using one of the following methods: ? Radiofrequency. This method uses a radiofrequency-alternating electric current to remove the endometrium. ? Cryotherapy. This method uses extreme cold to freeze the endometrium. ? Heated-free liquid. This method uses a heated saltwater (saline) solution to remove the endometrium. ? Microwave. This method uses high-energy microwaves to heat up the endometrium and  remove it. ? Thermal balloon. This method involves inserting a catheter with a balloon tip into the uterus. The balloon tip is filled with heated fluid to remove the endometrium. The procedure may vary among health care providers and hospitals. What happens after the procedure?  Your blood pressure, heart rate, breathing rate,  and blood oxygen level will be monitored until the medicines you were given have worn off.  As tissue healing occurs, you may notice vaginal bleeding for 4-6 weeks after the procedure. You may also experience: ? Cramps. ? Thin, watery vaginal discharge that is light pink or brown in color. ? A need to urinate more frequently than usual. ? Nausea.  Do not drive for 24 hours if you were given a sedative.  Do not have sex or insert anything into your vagina until your health care provider approves. Summary  Endometrial ablation is done to treat the many causes of heavy menstrual bleeding.  The procedure may be done only after medications have been tried to control the bleeding.  Plan to have someone take you home from the hospital or clinic. This information is not intended to replace advice given to you by your health care provider. Make sure you discuss any questions you have with your health care provider. Document Released: 12/14/2003 Document Revised: 02/21/2016 Document Reviewed: 02/21/2016 Elsevier Interactive Patient Education  2017 [employer] Hysteroscopy Hysteroscopy is a procedure used for looking inside the womb (uterus). It may be done for various reasons, including:  To evaluate abnormal bleeding, fibroid (benign, noncancerous) tumors, polyps, scar tissue (adhesions), and possibly cancer of the uterus.  To look for lumps (tumors) and other uterine growths.  To look for causes of why a woman cannot get pregnant (infertility), causes of recurrent loss of pregnancy (miscarriages), or a lost intrauterine device (IUD).  To perform a sterilization by blocking the fallopian tubes from inside the uterus.  In this procedure, a thin, flexible tube with a tiny light and camera on the end of it (hysteroscope) is used to look inside the uterus. A hysteroscopy should be done right after a menstrual period to be sure you are not pregnant. LET [facility] PROVID[facility] KNOW  ABOUT:  Any allergies you have.  All medicines you are taking, including vitamins, herbs, eye drops, creams, and over-the-counter medicines.  Previous problems you or members of your family have had with the use of anesthetics.  Any blood disorders you have.  Previous surgeries you have had.  Medical conditions you have. RISKS AND COMPLICATIONS Generally, this is a safe procedure. However, as with any procedure, complications can occur. Possible complications include:  Putting a hole in the uterus.  Excessive bleeding.  Infection.  Damage to the cervix.  Injury to other organs.  Allergic reaction to medicines.  Too much fluid used in the uterus for the procedure.  BEFORE THE PROCEDURE  Ask your health care provider about changing or stopping any regular medicines.  Do not take aspirin or blood thinners for 1 week before the procedure, or as directed by your health care provider. These can cause bleeding.  If you smoke, do not smoke for 2 weeks before the procedure.  In some cases, a medicine is placed in the cervix the day before the procedure. This medicine makes the cervix have a larger opening (dilate). This makes it easier for the instrument to be inserted into the uterus during the procedure.  Do not eat or drink anything for at least 8 hours before the  surgery.  Arrange for someone to take you home after the procedure. PROCEDURE  You may be given a medicine to relax you (sedative). You may also be given one of the following: ? A medicine that numbs the area around the cervix (local anesthetic). ? A medicine that makes you sleep through the procedure (general anesthetic).  The hysteroscope is inserted through the vagina into the uterus. The camera on the hysteroscope sends a picture to a TV screen. This gives the surgeon a good view inside the uterus.  During the procedure, air or a liquid is put into the uterus, which allows the surgeon to see  better.  Sometimes, tissue is gently scraped from inside the uterus. These tissue samples are sent to a lab for testing. What to expect after the procedure  If you had a general anesthetic, you may be groggy for a couple hours after the procedure.  If you had a local anesthetic, you will be able to go home as soon as you are stable and feel ready.  You may have some cramping. This normally lasts for a couple days.  You may have bleeding, which varies from light spotting for a few days to menstrual-like bleeding for 3-7 days. This is normal.  If your test results are not back during the visit, make an appointment with your health care provider to find out the results. This information is not intended to replace advice given to you by your health care provider. Make sure you discuss any questions you have with your health care provider. Document Released: 05/12/2000 Document Revised: 07/12/2015 Document Reviewed: 09/02/2012 Elsevier Interactive Patient Education  2017 [employer] Inc.  Dilation and Curettage or Vacuum Curettage Dilation and curettage (D&C) and vacuum curettage are minor procedures. A D&C involves stretching (dilation) the cervix and scraping (curettage) the inside lining of the uterus (endometrium). During a D&C, tissue is gently scraped from the endometrium, starting from the top portion of the uterus down to the lowest part of the uterus (cervix). During a vacuum curettage, the lining and tissue in the uterus are removed with the use of gentle suction. Curettage may be performed to either diagnose or treat a problem. As a diagnostic procedure, curettage is performed to examine tissues from the uterus. A diagnostic curettage may be done if you have:  Irregular bleeding in the uterus.  Bleeding with the development of clots.  Spotting between menstrual periods.  Prolonged menstrual periods or other abnormal bleeding.  Bleeding after menopause.  No menstrual period  (amenorrhea).  A change in size and shape of the uterus.  Abnormal endometrial cells discovered during a Pap test.  As a treatment procedure, curettage may be performed for the following reasons:  Removal of an IUD (intrauterine device).  Removal of retained placenta after giving birth.  Abortion.  Miscarriage.  Removal of endometrial polyps.  Removal of uncommon types of noncancerous lumps (fibroids).  Tell a health care provider about:  Any allergies you have, including allergies to prescribed medicine or latex.  All medicines you are taking, including vitamins, herbs, eye drops, creams, and over-the-counter medicines. This is especially important if you take any blood-thinning medicine. Bring a list of all of your medicines to your appointment.  Any problems you or family members have had with anesthetic medicines.  Any blood disorders you have.  Any surgeries you have had.  Your medical history and any medical conditions you have.  Whether you are pregnant or may be pregnant.  Recent vaginal infections  you have had.  Recent menstrual periods, bleeding problems you have had, and what form of birth control (contraception) you use. What are the risks? Generally, this is a safe procedure. However, problems may occur, including:  Infection.  Heavy vaginal bleeding.  Allergic reactions to medicines.  Damage to the cervix or other structures or organs.  Development of scar tissue (adhesions) inside the uterus, which can cause abnormal amounts of menstrual bleeding. This may make it harder to get pregnant in the future.  A hole (perforation) or puncture in the uterine wall. This is rare.  What happens before the procedure? Staying hydrated Follow instructions from your health care provider about hydration, which may include:  Up to 2 hours before the procedure - you may continue to drink clear liquids, such as water, clear fruit juice, black coffee, and plain  tea.  Eating and drinking restrictions Follow instructions from your health care provider about eating and drinking, which may include:  8 hours before the procedure - stop eating heavy meals or foods such as meat, fried foods, or fatty foods.  6 hours before the procedure - stop eating light meals or foods, such as toast or cereal.  6 hours before the procedure - stop drinking milk or drinks that contain milk.  2 hours before the procedure - stop drinking clear liquids. If your health care provider told you to take your medicine(s) on the day of your procedure, take them with only a sip of water.  Medicines  Ask your health care provider about: ? Changing or stopping your regular medicines. This is especially important if you are taking diabetes medicines or blood thinners. ? Taking medicines such as aspirin and ibuprofen. These medicines can thin your blood. Do not take these medicines before your procedure if your health care provider instructs you not to.  You may be given antibiotic medicine to help prevent infection. General instructions  For 24 hours before your procedure, do not: ? Douche. ? Use tampons. ? Use medicines, creams, or suppositories in the vagina. ? Have sexual intercourse.  You may be given a pregnancy test on the day of the procedure.  Plan to have someone take you home from the hospital or clinic.  You may have a blood or urine sample taken.  If you will be going home right after the procedure, plan to have someone with you for 24 hours. What happens during the procedure?  To reduce your risk of infection: ? Your health care team will wash or sanitize their hands. ? Your skin will be washed with soap.  An IV tube will be inserted into one of your veins.  You will be given one of the following: ? A medicine that numbs the area in and around the cervix (local anesthetic). ? A medicine to make you fall asleep (general anesthetic).  You will lie down  on your back, with your feet in foot rests (stirrups).  The size and position of your uterus will be checked.  A lubricated instrument (speculum or Sims retractor) will be inserted into the back side of your vagina. The speculum will be used to hold apart the walls of your vagina so your health care provider can see your cervix.  A tool (tenaculum) will be attached to the lip of the cervix to stabilize it.  Your cervix will be softened and dilated. This may be done by: ? Taking a medicine. ? Having tapered dilators or thin rods (laminaria) or gradual widening instruments (  tapered dilators) inserted into your cervix.  A small, sharp, curved instrument (curette) will be used to scrape a small amount of tissue or cells from the endometrium or cervical canal. In some cases, gentle suction is applied with the curette. The curette will then be removed. The cells will be taken to a lab for testing. The procedure may vary among health care providers and hospitals. What happens after the procedure?  You may have mild cramping, backache, pain, and light bleeding or spotting. You may pass small blood clots from your vagina.  You may have to wear compression stockings. These stockings help to prevent blood clots and reduce swelling in your legs.  Your blood pressure, heart rate, breathing rate, and blood oxygen level will be monitored until the medicines you were given have worn off. Summary  Dilation and curettage (D&C) involves stretching (dilation) the cervix and scraping (curettage) the inside lining of the uterus (endometrium).  After the procedure, you may have mild cramping, backache, pain, and light bleeding or spotting. You may pass small blood clots from your vagina.  Plan to have someone take you home from the hospital or clinic. This information is not intended to replace advice given to you by your health care provider. Make sure you discuss any questions you have with your health care  provider. Document Released: 02/03/2005 Document Revised: 10/21/2015 Document Reviewed: 10/21/2015 Elsevier Interactive Patient Education  2018 [employer].

## 2017-07-07 ENCOUNTER — Encounter: Payer: Self-pay | Admitting: Emergency Medicine

## 2017-07-07 ENCOUNTER — Other Ambulatory Visit: Payer: Self-pay

## 2017-07-07 ENCOUNTER — Ambulatory Visit
Admission: EM | Admit: 2017-07-07 | Discharge: 2017-07-07 | Disposition: A | Discharge status: Home / Self Care | Payer: Medicaid Other | Attending: Family Medicine | Admitting: Family Medicine

## 2017-07-07 DIAGNOSIS — J069 Acute upper respiratory infection, unspecified: Secondary | ICD-10-CM | POA: Diagnosis not present

## 2017-07-07 MED ORDER — AZITHROMYCIN 250 MG PO TABS: 250.0000 mg | ORAL_TABLET | Freq: Every day | ORAL | 0 refills | Status: DC

## 2017-07-07 MED ORDER — ALBUTEROL SULFATE HFA 108 (90 BASE) MCG/ACT IN AERS: 1.0000 | INHALATION_SPRAY | Freq: Four times a day (QID) | RESPIRATORY_TRACT | 0 refills | Status: DC | PRN

## 2017-07-07 MED ORDER — FLUTICASONE PROPIONATE 50 MCG/ACT NA SUSP: 2.0000 | Freq: Every day | NASAL | 0 refills | Status: DC

## 2017-07-07 MED ORDER — HYDROCOD POLST-CPM POLST ER 10-8 MG/5ML PO SUER: 5.0000 mL | Freq: Two times a day (BID) | ORAL | 0 refills | Status: DC

## 2017-07-07 MED ORDER — BENZONATATE 200 MG PO CAPS: ORAL_CAPSULE | ORAL | 0 refills | Status: DC

## 2017-07-07 NOTE — ED Triage Notes (Signed)
Patient c/o runny nose, cough and congestion for a week.

## 2017-07-07 NOTE — ED Provider Notes (Signed)
[facility]    CSN: [id number] Arrival date & time: 07/07/17  1456     History   Chief Complaint Chief Complaint  Patient presents with  . Cough    HPI Theresa Ryan is a 47 y.o. female.   HPI  47 year old female presents with a one-week history of runny nose cough and congestion.  Is returned from a trip to [place] on Mother's Day when her symptoms began.  Smoker.  She has noticed wheezing at times and has shortness of breath.  Denies any fever or chills.  States that she has had dark green-yellow sputum goes into proximal-isms of coughing at times.  Not been using any medications.        Past Medical History:  Diagnosis Date  . Abnormal Pap smear of cervix   . DDD (degenerative disc disease), cervical   . 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 STD exposure 11/26/2016  . Abnormal uterine bleeding (AUB) 10/22/2016  . Pelvic pain 10/22/2016  . History of cervical dysplasia 10/22/2016  . History of D&C 10/22/2016  . History of tubal ligation 10/22/2016  . Tobacco user 10/22/2016    Past Surgical History:  Procedure Laterality Date  . BACK SURGERY    . CERVICAL BIOPSY  W/ LOOP ELECTRODE EXCISION    . CESAREAN SECTION    . CHOLECYSTECTOMY    . CRYOTHERAPY    . DILATION AND CURETTAGE OF UTERUS    . LAPAROSCOPY    . TUBAL LIGATION  2006    OB History    Gravida  6   Para  4   Term  2   Preterm  2   AB  2   Living  3     SAB      TAB  2   Ectopic      Multiple      Live Births  3            Home Medications    Prior to Admission medications   Medication Sig Start Date End Date Taking? Authorizing Provider  Multiple Vitamin  (MULTIVITAMIN) capsule Take 1 capsule by mouth daily.   Yes [provider]  albuterol (PROVENTIL HFA;VENTOLIN HFA) 108 (90 Base) MCG/ACT inhaler Inhale 1-2 puffs into the lungs every 6 (six) hours as needed for wheezing or shortness of breath. Use with spacer 07/07/17   Lutricia Feil, PA-C  azithromycin (ZITHROMAX) 250 MG tablet Take 1 tablet (250 mg total) by mouth daily. Take first 2 tablets together, then 1 every day until finished. 07/07/17   Lutricia Feil, PA-C  benzonatate (TESSALON) 200 MG capsule Take one cap TID PRN cough 07/07/17   Lutricia Feil, PA-C  chlorpheniramine-HYDROcodone [facility]) 10-8 MG/5ML SUER Take 5 mLs by mouth 2 (two) times daily. 07/07/17   Lutricia Feil, PA-C  fluticasone (FLONASE) 50 MCG/ACT nasal spray Place 2 sprays into both nostrils daily. 07/07/17   Lutricia Feil, PA-C    Family History Family History  Problem Relation Age of Onset  . Diabetes Mother   . Colon cancer Maternal Grandmother   . Diabetes Daughter   . Breast cancer Neg Hx   .  Ovarian cancer Neg Hx     Social History Social History   Tobacco Use  . Smoking status: Current Every Day Smoker    Packs/day: 0.50    Types: Cigarettes  . Smokeless tobacco: Never Used  Substance Use Topics  . Alcohol use: No  . Drug use: No     Allergies   Vicoprofen [hydrocodone-ibuprofen]   Review of Systems Review of Systems  Constitutional: Positive for activity change. Negative for appetite change, chills, fatigue and fever.  HENT: Positive for congestion. Negative for ear pain and postnasal drip.   Respiratory: Positive for cough and shortness of breath.   All other systems reviewed and are negative.    Physical Exam Triage Vital Signs ED Triage Vitals  Enc Vitals Group     BP 07/07/17 1506 117/74     Pulse Rate 07/07/17 1506 72     Resp 07/07/17 1506 16     Temp 07/07/17 1506 98.4 F (36.9 C)     Temp Source 07/07/17 1506 Oral     SpO2  07/07/17 1506 100 %     Weight 07/07/17 1504 145 lb (65.8 kg)     Height 07/07/17 1504  (1.626 m)     Head Circumference --      Peak Flow --      Pain Score 07/07/17 1504 6     Pain Loc --      Pain Edu? --      Excl. in GC? --    No data found.  Updated Vital Signs BP 117/74 (BP Location: Right Arm)   Pulse 72   Temp 98.4 F (36.9 C) (Oral)   Resp 16   Ht  (1.626 m)   Wt 145 lb (65.8 kg)   LMP 06/30/2017 (Approximate)   SpO2 100%   BMI 24.89 kg/m   Visual Acuity Right Eye Distance:   Left Eye Distance:   Bilateral Distance:    Right Eye Near:   Left Eye Near:    Bilateral Near:     Physical Exam  Constitutional: She is oriented to person, place, and time. She appears well-developed and well-nourished. No distress.  HENT:  Head: Normocephalic.  Right Ear: External ear normal.  Left Ear: External ear normal.  Nose: Nose normal.  Mouth/Throat: Oropharynx is clear and moist. No oropharyngeal exudate.  Eyes: Pupils are equal, round, and reactive to light. Conjunctivae are normal. Right eye exhibits no discharge. Left eye exhibits no discharge.  Neck: Normal range of motion.  Pulmonary/Chest: Effort normal and breath sounds normal.  Sounds are harsh  Musculoskeletal: Normal range of motion.  Neurological: She is alert and oriented to person, place, and time.  Skin: Skin is warm and dry. She is not diaphoretic.  Psychiatric: She has a normal mood and affect. Her behavior is normal. Judgment and thought content normal.  Nursing note and vitals reviewed.    UC Treatments / Results  Labs (all labs ordered are listed, but only abnormal results are displayed) Labs Reviewed - No data to display  EKG None  Radiology No results found.  Procedures Procedures (including critical care time)  Medications Ordered in UC Medications - No data to display  Initial Impression / Assessment and Plan / UC Course  I have reviewed the triage vital signs and the  nursing notes.  Pertinent labs & imaging results that were available during my care of the patient were reviewed by me and considered in my medical decision making (see  chart for details).    Plan: 1. Test/x-ray results and diagnosis reviewed with patient 2. rx as per orders; risks, benefits, potential side effects reviewed with patient 3. Recommend supportive treatment with fluids.  Cessation of smoking.  Scribe cough suppressants to help with her proximal-isms of coughing.  I will also recommend she use Flonase 2 sprays once daily for the next 3 to 4 weeks.  The length of time that she has had the symptoms and the worsening of her symptoms in the last few days I will start her on a Z-Pak.  If she is not improving she should follow-up in our clinic or with her primary care physician. 4. F/u prn if symptoms worsen or don't improve  Final Clinical Impressions(s) / UC Diagnoses   Final diagnoses:  Acute upper respiratory infection   Discharge Instructions   None    ED Prescriptions    Medication Sig Dispense Auth. Provider   benzonatate (TESSALON) 200 MG capsule Take one cap TID PRN cough 30 capsule Lutricia Feil, PA-C   chlorpheniramine-HYDROcodone (TUSSIONEX PENNKINETIC ER) 10-8 MG/5ML SUER Take 5 mLs by mouth 2 (two) times daily. 115 mL Ovid Curd P, PA-C   azithromycin (ZITHROMAX) 250 MG tablet Take 1 tablet (250 mg total) by mouth daily. Take first 2 tablets together, then 1 every day until finished. 6 tablet Ovid Curd P, PA-C   albuterol (PROVENTIL HFA;VENTOLIN HFA) 108 (90 Base) MCG/ACT inhaler Inhale 1-2 puffs into the lungs every 6 (six) hours as needed for wheezing or shortness of breath. Use with spacer 1 Inhaler Ovid Curd P, PA-C   fluticasone (FLONASE) 50 MCG/ACT nasal spray Place 2 sprays into both nostrils daily. 16 g Lutricia Feil, PA-C     Controlled Substance Prescriptions [place] Controlled Substance Registry consulted? Not Applicable   Lutricia Feil, PA-C 07/07/17 1621

## 2017-08-06 ENCOUNTER — Ambulatory Visit: Payer: Medicaid Other

## 2017-08-06 ENCOUNTER — Encounter: Payer: Self-pay | Admitting: Emergency Medicine

## 2017-08-06 ENCOUNTER — Ambulatory Visit
Admission: EM | Admit: 2017-08-06 | Discharge: 2017-08-06 | Disposition: A | Discharge status: Home / Self Care | Payer: Medicaid Other | Attending: Emergency Medicine | Admitting: Emergency Medicine

## 2017-08-06 ENCOUNTER — Other Ambulatory Visit: Payer: Self-pay

## 2017-08-06 DIAGNOSIS — R05 Cough: Secondary | ICD-10-CM | POA: Diagnosis not present

## 2017-08-06 DIAGNOSIS — J0100 Acute maxillary sinusitis, unspecified: Secondary | ICD-10-CM | POA: Insufficient documentation

## 2017-08-06 DIAGNOSIS — F17210 Nicotine dependence, cigarettes, uncomplicated: Secondary | ICD-10-CM | POA: Insufficient documentation

## 2017-08-06 DIAGNOSIS — Z7951 Long term (current) use of inhaled steroids: Secondary | ICD-10-CM | POA: Diagnosis not present

## 2017-08-06 DIAGNOSIS — Z79899 Other long term (current) drug therapy: Secondary | ICD-10-CM | POA: Diagnosis not present

## 2017-08-06 DIAGNOSIS — K219 Gastro-esophageal reflux disease without esophagitis: Secondary | ICD-10-CM | POA: Insufficient documentation

## 2017-08-06 DIAGNOSIS — R059 Cough, unspecified: Secondary | ICD-10-CM

## 2017-08-06 MED ORDER — IPRATROPIUM BROMIDE 0.06 % NA SOLN: 2.0000 | Freq: Four times a day (QID) | NASAL | 0 refills | Status: DC

## 2017-08-06 MED ORDER — DOXYCYCLINE HYCLATE 100 MG PO CAPS: 100.0000 mg | ORAL_CAPSULE | Freq: Two times a day (BID) | ORAL | 0 refills | Status: AC

## 2017-08-06 MED ORDER — AEROCHAMBER PLUS MISC: 2 refills | Status: DC

## 2017-08-06 NOTE — ED Triage Notes (Signed)
Patient in today c/o continued cough. Patient was seen 07/07/17 for same. She was not able to get Tussinex filled due to insurance not covering.

## 2017-08-06 NOTE — Discharge Instructions (Addendum)
Use a NeilMed sinus rinse as often as you want with distilled water to reduce nasal congestion and postnasal drip. Follow the directions on the box.  Here is a list of primary care providers who are taking new patients:  Dr. Elizabeth Sauereanna Jones, Dr. Schuyler AmorWilliam Plonk [address] [place] [address] [phone]  [facility] [place] [address]  [place] [address]  [phone]  [facility] [address]  [place], [place] [address] [phone]  [facility] [place] [address]  [phone] [place], [place] [address]  Here are clinics/ other resources who will see you if you do not have insurance. Some have certain criteria that you must meet. Call them and find out what they are:  [facility]: [address] [place] [place], [place] [address] Phone: [phone] Hours: First and Third Saturdays of each Month, 9 a.m. - 1 p.m.  [facility]: [address], [place], [place] [address] Phone: [phone] Hours: Tuesday, 4 p.m. - 8 p.m. Thursday, 1 p.m. - 8 p.m. Wednesday, 9 a.m. - [facility]  [facility] [address], [place], [place] [address] Phone: [phone] Pharmacy Phone Number: [phone] Dental Phone Number: [phone] [facility] Insurance Help: [phone]  Dental Hours: Monday - Thursday, 8 a.m. - 6 p.m.  [facility] [address] [place], [place] [address] Phone: [phone] Pharmacy Phone Number: [phone] [facility] Insurance Help: [phone]  [facility] [address] [place], [place] [address] Phone: [phone] Pharmacy Phone Number: [phone] [facility] Insurance Help: [phone]  [facility] [address] [place], [place] [address] Phone: [phone] [facility] Insurance Help: [phone]   [facility] [address] [place], [place] [address] Phone: [phone]  Go to www.goodrx.com to look up your  medications. This will give you a list of where you can find your prescriptions at the most affordable prices. Or ask the pharmacist what the cash price is, or if they have any other discount programs available to help make your medication more affordable. This can be less expensive than what you would pay with insurance.

## 2017-08-06 NOTE — ED Provider Notes (Signed)
HPI  SUBJECTIVE:  Theresa Ryan is a 47 y.o. female who presents with 1 month of a persistent cough. Patient was seen in this clinic 1 month ago for cough, runny nose, congestion.  She was advised to quit smoking, given cough suppressants, albuterol, Flonase, Z-Pak.  She states that she never filled Flonase, because she already has some at home, and could not afford the Tussionex.  She states that she took the azithromycin and has been using the albuterol inhaler with some improvement in her cough.  Symptoms are worse first thing in the morning when she sits up.  She reports that the cough was initially productive, and is now dry.  She reports chest tightness in the morning.  She reports coughing spells, but no posttussive emesis.  No fevers, nasal congestion, rhinorrhea, postnasal drip.  No unintentional weight loss, night sweats.  She reports left sinus pressure and tenderness, no allergy or GERD symptoms.  No wheezing, chest pain, shortness of breath.  She reports mild occasional shortness of breath with exertion especially in the heat.  No lower extremity edema, unintentional weight gain, abdominal pain, PND, nocturia, orthopnea.  No calf pain, swelling, surgery in the past 4 weeks, prolonged immobilization, exogenous estrogen, hemoptysis.  She has been smoking half a pack a day of cigarettes for 18 years.  She has a history of GERD.  No history of asthma, emphysema, COPD, hypertension, ACE inhibitor use, allergies, DVT, PE, cancer, CHF.  LMP: Several weeks ago.  Denies the possibility being pregnant.  Is status post bilateral tubal ligation.  PMD: None.  Past Medical History:  Diagnosis Date  . Abnormal Pap smear of cervix   . DDD (degenerative disc disease), cervical   . 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 BACK SURGERY    .  CERVICAL BIOPSY  W/ LOOP ELECTRODE EXCISION    . CESAREAN SECTION    . CHOLECYSTECTOMY    . CRYOTHERAPY    . DILATION AND CURETTAGE OF UTERUS    . LAPAROSCOPY    . TUBAL LIGATION  2006    Family History  Problem Relation Age of Onset  . Diabetes Mother   . Colon cancer Maternal Grandmother   . Diabetes Daughter   . Breast cancer Neg Hx   . Ovarian cancer Neg Hx     Social History   Tobacco Use  . Smoking status: Current Every Day Smoker    Packs/day: 0.50    Types: Cigarettes  . Smokeless tobacco: Never Used  Substance Use Topics  . Alcohol use: No  . Drug use: No    No current facility-administered medications for this encounter.   Current Outpatient Medications:  .  albuterol (PROVENTIL HFA;VENTOLIN HFA) 108 (90 Base) MCG/ACT inhaler, Inhale 1-2 puffs into the lungs every 6 (six) hours as needed for wheezing or shortness of breath. Use with spacer, Disp: 1 Inhaler, Rfl: 0 .  Multiple Vitamin (MULTIVITAMIN) capsule, Take 1 capsule by mouth daily., Disp: , Rfl:  .  benzonatate (TESSALON) 200 MG capsule, Take one cap TID PRN cough, Disp: 30 capsule, Rfl: 0 .  doxycycline (VIBRAMYCIN) 100 MG capsule, Take 1 capsule (100 mg total) by mouth 2 (two) times daily for 7 days., Disp: 14 capsule, Rfl: 0 .  fluticasone (FLONASE) 50 MCG/ACT nasal spray, Place 2 sprays into both  nostrils daily., Disp: 16 g, Rfl: 0 .  ipratropium (ATROVENT) 0.06 % nasal spray, Place 2 sprays into both nostrils 4 (four) times daily. 3-4 times/ day, Disp: 15 mL, Rfl: 0 .  Spacer/Aero-Holding Chambers (AEROCHAMBER PLUS) inhaler, Use as instructed, Disp: 1 each, Rfl: 2  Allergies  Allergen Reactions  . Vicoprofen [Hydrocodone-Ibuprofen] Other (See Comments)    Chest pains     ROS  As noted in HPI.   Physical Exam  BP 122/89 (BP Location: Left Arm)   Pulse 84   Temp 98.6 F (37 C) (Oral)   Resp 16   Ht 5\' 4"  (1.626 m)   Wt 145 lb (65.8 kg)   LMP 07/23/2017 (Approximate) Comment: denies preg   SpO2 100%   BMI 24.89 kg/m   Constitutional: Well developed, well nourished, no acute distress Eyes:  EOMI, conjunctiva normal bilaterally HENT: Normocephalic, atraumatic,mucus membranes moist.  Positive nasal congestion.  Erythematous, swollen turbinates. positive left-sided maxillary sinus tenderness.  No other sinus tenderness.  Positive cobblestoning and postnasal drip. Respiratory: Normal inspiratory effort, lungs clear bilaterally, good air movement.  No chest wall tenderness Cardiovascular: Normal rate, regular rhythm, no murmurs, rubs, gallops GI: nondistended skin: No rash, skin intact Musculoskeletal: no deformities.  Calves symmetric, nontender, no edema. Neurologic: Alert & oriented x 3, no focal neuro deficits Psychiatric: Speech and behavior appropriate   ED Course   Medications - No data to display  Orders Placed This Encounter  Procedures  . DG Chest 2 View    Standing Status:   Standing    Number of Occurrences:   1    Order Specific Question:   Reason for Exam (SYMPTOM  OR DIAGNOSIS REQUIRED)    Answer:   cough x 1 month r/o PNA, mass, pulm edema, effusion    No results found for this or any previous visit (from the past 24 hour(s)). Dg Chest 2 View  Result Date: 08/06/2017 CLINICAL DATA:  Cough EXAM: CHEST - 2 VIEW COMPARISON:  February 04, 2011 FINDINGS: Lungs are clear. Heart size and pulmonary vascularity are normal. No adenopathy. No bone lesions. IMPRESSION: No edema or consolidation. Electronically Signed   By: Bretta BangWilliam  Woodruff III M.D.   On: 08/06/2017 14:00    ED Clinical Impression  Cough  Acute non-recurrent maxillary sinusitis   ED Assessment/Plan  [place] Narcotic database reviewed for this patient, and feel that the risk/benefit ratio today is favorable for proceeding with a prescription for controlled substance.  No opiate prescriptions in 2 years.  Patient states that she cannot tolerate hydrocodone/ibuprofen combination but is able to  tolerate hydrocodone without any problem.  Previous records reviewed as noted in HPI.  Discussed with the patient the differential of cough including sinusitis/allergies/postnasal drip, pneumonia, COPD, GERD. she is not on any ACEi, has no signs or symptoms of CHF, and patient is PERC negative.   Has normal vitals.  Doubt PE.  Obtaining chest x-ray to rule out mass, pneumonia. Given the sinus pain pressure, sinus tenderness, postnasal drip, suspect sinusitis causing her cough.  She may also have a component of COPD because she is a smoker.  Will send home with Atrovent nasal spray, saline nasal irrigation, doxycycline which will cover a sinusitis in addition to a pneumonia, spacer for her to use with her albuterol inhaler.  Will provide a primary care referral list for ongoing care.  Reviewed imaging independently. Normal CXR.  See radiology report for full details.  Discussed imaging, MDM, treatment plan, and plan for  follow-up with patient.  patient agrees with plan.   Meds ordered this encounter  Medications  . doxycycline (VIBRAMYCIN) 100 MG capsule    Sig: Take 1 capsule (100 mg total) by mouth 2 (two) times daily for 7 days.    Dispense:  14 capsule    Refill:  0  . ipratropium (ATROVENT) 0.06 % nasal spray    Sig: Place 2 sprays into both nostrils 4 (four) times daily. 3-4 times/ day    Dispense:  15 mL    Refill:  0  . Spacer/Aero-Holding Chambers (AEROCHAMBER PLUS) inhaler    Sig: Use as instructed    Dispense:  1 each    Refill:  2    *This clinic note was created using Dragon dictation software. Therefore, there may be occasional mistakes despite careful proofreading.   ?   Domenick Gong, MD 08/06/17 [phone]

## 2017-11-18 ENCOUNTER — Other Ambulatory Visit: Payer: Self-pay

## 2017-11-18 ENCOUNTER — Encounter: Payer: Self-pay | Admitting: Emergency Medicine

## 2017-11-18 ENCOUNTER — Ambulatory Visit
Admission: EM | Admit: 2017-11-18 | Discharge: 2017-11-18 | Disposition: A | Discharge status: Home / Self Care | Payer: Medicaid Other | Attending: Family Medicine | Admitting: Family Medicine

## 2017-11-18 DIAGNOSIS — T148XXA Other injury of unspecified body region, initial encounter: Secondary | ICD-10-CM

## 2017-11-18 DIAGNOSIS — Z4802 Encounter for removal of sutures: Secondary | ICD-10-CM

## 2017-11-18 DIAGNOSIS — S81812D Laceration without foreign body, left lower leg, subsequent encounter: Secondary | ICD-10-CM

## 2017-11-18 DIAGNOSIS — L089 Local infection of the skin and subcutaneous tissue, unspecified: Secondary | ICD-10-CM

## 2017-11-18 MED ORDER — DOXYCYCLINE MONOHYDRATE 25 MG/5ML PO SUSR: 100.0000 mg | Freq: Two times a day (BID) | ORAL | 0 refills | Status: AC

## 2017-11-18 NOTE — Discharge Instructions (Signed)
Recommend clean area with soap and water. Keep covered for the next 48 hours. Start Doxycycline liquid- take 4 teaspoons twice a day as directed. Continue to monitor wound area. If any increase in redness, swelling or discharge occurs, follow-up here or go to the ER.

## 2017-11-18 NOTE — ED Triage Notes (Signed)
Patient here for wound check to left lower leg. Patient had 7 stitches placed 11 days ago and was told to have them removed in 10 days. Patient was also given an Amoxicillin to take for 7 days however she states she only took 3 pills because they were to big. Patient has redness and swelling around the stitches to her lower leg.

## 2017-11-19 ENCOUNTER — Telehealth (HOSPITAL_COMMUNITY): Payer: Self-pay

## 2017-11-19 MED ORDER — ONDANSETRON HCL 8 MG PO TABS: 8.0000 mg | ORAL_TABLET | Freq: Three times a day (TID) | ORAL | 0 refills | Status: DC

## 2017-11-19 MED ORDER — DOXYCYCLINE HYCLATE 100 MG PO CAPS: 100.0000 mg | ORAL_CAPSULE | Freq: Two times a day (BID) | ORAL | 0 refills | Status: AC

## 2017-11-19 NOTE — Telephone Encounter (Signed)
Pt called clinic stating that her insurance would not cover liquid doxycyline. Reports nausea with the pills. rx for the pills sent to pharmacy of choice along with zofran for nausea. Patient educated to eat something before taking medication as well.

## 2017-11-19 NOTE — ED Provider Notes (Signed)
[facility]    CSN: [id number] Arrival date & time: 11/18/17  1733     History   Chief Complaint Chief Complaint  Patient presents with  . Suture / Staple Removal  . Wound Check    HPI Theresa Ryan is a 47 y.o. female.   47 year old female presents for wound check and suture removal on her left lower leg. She was seen in [facility] ER 11 days ago 11/07/17 with a laceration to her left lower leg as well as a deep laceration inside her mouth on her upper lip. She had 7 dissolvable sutures placed in her upper lip and 7 prolene sutures placed on left lower calf area. She was placed on Augmentin for infection prevention, especially for the area in her mouth. She took 3 doses of the Augmentin but had to stop due to stomach upset (vomited with each dose). She tried breaking the antibiotic in half and taking it with food with no help. Now she is concerned that there is more redness and slight swelling along the suture margins of her wound on her leg and may be infected. No distinct discharge or drainage. She does smoke cigarettes daily but no other chronic health issues. Takes no daily medication.   The history is provided by the patient.    Past Medical History:  Diagnosis Date  . Abnormal Pap smear of cervix   . DDD (degenerative disc disease), cervical   . 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 STD exposure 11/26/2016  . Abnormal uterine bleeding (AUB) 10/22/2016  . Pelvic pain 10/22/2016  . History of cervical dysplasia 10/22/2016  . History of D&C 10/22/2016  . History of tubal ligation 10/22/2016  . Tobacco user 10/22/2016    Past Surgical History:  Procedure  Laterality Date  . BACK SURGERY    . CERVICAL BIOPSY  W/ LOOP ELECTRODE EXCISION    . CESAREAN SECTION    . CHOLECYSTECTOMY    . CRYOTHERAPY    . DILATION AND CURETTAGE OF UTERUS    . LAPAROSCOPY    . TUBAL LIGATION  2006    OB History    Gravida  6   Para  4   Term  2   Preterm  2   AB  2   Living  3     SAB      TAB  2   Ectopic      Multiple      Live Births  3            Home Medications    Prior to Admission medications   Medication Sig Start Date End Date Taking? Authorizing Provider  doxycycline (VIBRAMYCIN) 100 MG capsule Take 1 capsule (100 mg total) by mouth 2 (two) times daily for 5 days. 11/19/17 11/24/17  Domenick Gong, MD  doxycycline (VIBRAMYCIN) 25 MG/5ML SUSR Take 20 mLs (100 mg total) by mouth 2 (two) times daily for 5 days. 11/18/17 11/23/17  Sudie Grumbling, NP  ondansetron (ZOFRAN) 8 MG tablet Take 1 tablet (8 mg total) by mouth 3 (three) times daily. 11/19/17   Domenick Gong, MD    Family History Family History  Problem Relation Age of Onset  . Diabetes Mother   . Colon cancer Maternal Grandmother   . Diabetes Daughter   . Breast cancer Neg Hx   . Ovarian cancer Neg Hx     Social History Social History   Tobacco Use  . Smoking status: Current Every Day Smoker    Packs/day: 0.50    Types: Cigarettes  . Smokeless tobacco: Never Used  Substance Use Topics  . Alcohol use: No  . Drug use: No     Allergies   Vicoprofen [hydrocodone-ibuprofen]   Review of Systems Review of Systems  Constitutional: Negative for activity change, appetite change, chills, fatigue and fever.  HENT: Positive for mouth sores (laceration- healing). Negative for congestion, facial swelling, postnasal drip, rhinorrhea, sore throat and trouble swallowing.   Gastrointestinal: Positive for vomiting (with antibiotic use). Negative for nausea.  Musculoskeletal: Negative for arthralgias, gait problem, joint swelling and myalgias.  Skin: Positive  for color change and wound. Negative for pallor and rash.  Neurological: Negative for dizziness, tremors, seizures, syncope, weakness, light-headedness, numbness and headaches.  Hematological: Negative for adenopathy. Does not bruise/bleed easily.     Physical Exam Triage Vital Signs ED Triage Vitals  Enc Vitals Group     BP 11/18/17 1748 140/85     Pulse Rate 11/18/17 1748 87     Resp 11/18/17 1748 18     Temp 11/18/17 1748 99.2 F (37.3 C)     Temp Source 11/18/17 1748 Oral     SpO2 11/18/17 1748 99 %     Weight 11/18/17 1749 149 lb (67.6 kg)     Height 11/18/17 1749 5\' 4"  (1.626 m)     Head Circumference --      Peak Flow --      Pain Score 11/18/17 1748 1     Pain Loc --      Pain Edu? --      Excl. in GC? --    No data found.  Updated Vital Signs BP 140/85 (BP Location: Left Arm)   Pulse 87   Temp 99.2 F (37.3 C) (Oral)   Resp 18   Ht 5\' 4"  (1.626 m)   Wt 149 lb (67.6 kg)   LMP 10/26/2017   SpO2 99%   BMI 25.58 kg/m   Visual Acuity Right Eye Distance:   Left Eye Distance:   Bilateral Distance:    Right Eye Near:   Left Eye Near:    Bilateral Near:     Physical Exam  Constitutional: She is oriented to person, place, and time. Vital signs are normal. She appears well-developed and well-nourished. She is cooperative. She does not appear ill. No distress.  Patient sitting comfortably on exam table in no acute distress.   HENT:  Head: Normocephalic and atraumatic.  Right Ear: External ear normal.  Left Ear: External ear normal.  Mouth/Throat: Oropharynx is clear and moist and mucous membranes are normal. Lacerations present. No posterior oropharyngeal erythema.  Healing laceration on left upper inner lip just lateral to midline. No redness, swelling or tenderness. No discharge. Edges well-approximated.   Eyes: Conjunctivae and EOM are normal.  Neck: Normal range of motion.  Cardiovascular: Normal rate.  Pulmonary/Chest: Effort normal.  Musculoskeletal:  Normal range of motion. She exhibits tenderness.       Left lower leg: She exhibits swelling and laceration. She exhibits no tenderness and no edema.       Legs: About 3cm straight laceration with 7 sutures in place.  Edges well-approximated. Slightly red, swollen and tender along suture line. No distinct discharge. Redness extends another 1cm around suture area.   Neurological: She is alert and oriented to person, place, and time.  Skin: Skin is warm and dry. Capillary refill takes less than 2 seconds. Laceration (healing) noted. No abrasion, no bruising, no ecchymosis, no petechiae and no rash noted. There is erythema.  Psychiatric: She has a normal mood and affect. Her speech is normal and behavior is normal. Thought content normal.  Vitals reviewed.    UC Treatments / Results  Labs (all labs ordered are listed, but only abnormal results are displayed) Labs Reviewed - No data to display  EKG None  Radiology No results found.  Procedures Procedures (including critical care time)  Medications Ordered in UC Medications - No data to display  Initial Impression / Assessment and Plan / UC Course  I have reviewed the triage vital signs and the nursing notes.  Pertinent labs & imaging results that were available during my care of the patient were reviewed by me and considered in my medical decision making (see chart for details).    7 Sutures removed on left lower leg with ease. Edges well approximated. Minimal bleeding. No discharge. Slightly red and tender to touch. Applied triple antibiotic ointment and covered with non-stick gauze. Keep clean and dry for next 24 hours then use soap and water and clean as usual. Since patient unable to tolerate pills, will trial liquid Doxycycline 100mg  every 12 hours for the next 5 days- take with food. Continue to monitor area. If any increase in redness, swelling or discharge occurs, follow-up here or go to the ER.  Final Clinical Impressions(s) /  UC Diagnoses   Final diagnoses:  Visit for suture removal  Wound infection     Discharge Instructions     Recommend clean area with soap and water. Keep covered for the next 48 hours. Start Doxycycline liquid- take 4 teaspoons twice a day as directed. Continue to monitor wound area. If any increase in redness, swelling or discharge occurs, follow-up here or go to the ER.     ED Prescriptions    Medication Sig Dispense Auth. Provider   doxycycline (VIBRAMYCIN) 25 MG/5ML SUSR Take 20 mLs (100 mg total) by mouth 2 (two) times daily for 5 days. 200 mL Sudie Grumbling, NP     Controlled Substance Prescriptions [place] Controlled Substance Registry consulted? Not Applicable   Sudie Grumbling, NP 11/19/17 1203

## 2017-12-11 NOTE — Progress Notes (Signed)
ANNUAL PREVENTATIVE CARE GYN  ENCOUNTER NOTE  Subjective:       Theresa Ryan is a 47 y.o. [phone] female s/p tubal ligation here for a routine annual gynecologic exam.  Current complaints:  1. None  Patient reports onset of regular cycles after her last Depo-Provera shot October 2018.  Menses became regular in February 2019.  She has mild dysmenorrhea with occasional need for ibuprofen.  Patient states that she has had dysmenorrhea when younger and occasionally would miss school because of the severity of her symptoms.  She does not report any deep thrusting dyspareunia. Bowel function is normal. Bladder function is normal. Patient states that she is smoking less than 1/2 pack of cigarettes a day and is attempting to quit.    Gynecologic History LMP-12/05/2017  Contraception: tubal ligation Last Pap: 11/19/2016 neg/pos  Results were: normal Last mammogram: 12/11/2016 birad 1 Results were: normal  Obstetric History OB History  Gravida Para Term Preterm AB Living  6 4 2 2 2 3   SAB TAB Ectopic Multiple Live Births    2     3    # Outcome Date GA Lbr Len/2nd Weight Sex Delivery Anes PTL Lv  6 Preterm 2006        FD  5 Preterm 2000     CS-LTranv   LIV     Complications: Abruptio Placenta  4 TAB 1998          3 Term 21    F Vag-Spont   LIV  2 TAB 1991          1 Term 78    F Vag-Spont   LIV    Past Medical History:  Diagnosis Date  . Abnormal Pap smear of cervix   . DDD (degenerative disc disease), cervical   . 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 BACK SURGERY    . CERVICAL BIOPSY  W/ LOOP ELECTRODE EXCISION    . CESAREAN SECTION    . CHOLECYSTECTOMY    . CRYOTHERAPY    . DILATION AND CURETTAGE OF UTERUS    . LAPAROSCOPY    . TUBAL LIGATION  2006    Current Outpatient Medications on File Prior  to Visit  Medication Sig Dispense Refill  . ondansetron (ZOFRAN) 8 MG tablet Take 1 tablet (8 mg total) by mouth 3 (three) times daily. 20 tablet 0   No current facility-administered medications on file prior to visit.     Allergies  Allergen Reactions  . Vicoprofen [Hydrocodone-Ibuprofen] Other (See Comments)    Chest pains    Social History   Socioeconomic History  . Marital status: Single    Spouse name: Not on file  . Number of children: Not on file  . Years of education: Not on file  . Highest education level: Not on file  Occupational History  . Not on file  Social Needs  . Financial resource strain: Not on file  . Food insecurity:    Worry: Not on file    Inability: Not on file  . Transportation needs:    Medical: Not on file    Non-medical: Not on file  Tobacco Use  . Smoking status: Current Every Day Smoker    Packs/day: 0.50    Types: Cigarettes  . Smokeless tobacco: Never Used  Substance and Sexual  Activity  . Alcohol use: No  . Drug use: No  . Sexual activity: Yes    Birth control/protection: Surgical, Condom  Lifestyle  . Physical activity:    Days per week: Not on file    Minutes per session: Not on file  . Stress: Not on file  Relationships  . Social connections:    Talks on phone: Not on file    Gets together: Not on file    Attends religious service: Not on file    Active member of club or organization: Not on file    Attends meetings of clubs or organizations: Not on file    Relationship status: Not on file  . Intimate partner violence:    Fear of current or ex partner: Not on file    Emotionally abused: Not on file    Physically abused: Not on file    Forced sexual activity: Not on file  Other Topics Concern  . Not on file  Social History Narrative  . Not on file    Family History  Problem Relation Age of Onset  . Diabetes Mother   . Colon cancer Maternal Grandmother   . Diabetes Daughter   . Breast cancer Neg Hx   . Ovarian  cancer Neg Hx     The following portions of the patient's history were reviewed and updated as appropriate: allergies, current medications, past family history, past medical history, past social history, past surgical history and problem list.  Review of Systems Review of Systems  Constitutional: Negative.   Respiratory: Negative.   Cardiovascular: Negative.   Gastrointestinal: Negative.   Genitourinary: Negative.   Musculoskeletal: Negative.   Skin: Negative.   All other systems reviewed and are negative.     Objective:   BP 100/62   Pulse 80   Ht 5\' 4"  (1.626 m)   Wt 148 lb (67.1 kg)   LMP 12/05/2017 (Exact Date)   BMI 25.40 kg/m  CONSTITUTIONAL: Well-developed, well-nourished female in no acute distress.  PSYCHIATRIC: Normal mood and affect. Normal behavior. Normal judgment and thought content. NEUROLGIC: Alert and oriented to person, place, and time. Normal muscle tone coordination. No cranial nerve deficit noted. HENT:  Normocephalic, atraumatic, External right and left ear normal.  EYES: Conjunctivae and EOM are normal. No scleral icterus.  NECK: Normal range of motion, supple, no masses.  Normal thyroid.  SKIN: Skin is warm and dry. No rash noted. Not diaphoretic. No erythema. No pallor. CARDIOVASCULAR: Normal heart rate noted, regular rhythm, no murmur. RESPIRATORY: Clear to auscultation bilaterally. Effort and breath sounds normal, no problems with respiration noted. BREASTS: Symmetric in size. No masses, skin changes, nipple drainage, or lymphadenopathy. ABDOMEN: Soft, normal bowel sounds, no distention noted.  No tenderness, rebound or guarding.  BLADDER: Normal PELVIC:  External Genitalia: 1 cm cystic mass, non-tense, nontender, without erythema, without drainage located in the midline just inferior to the clitoris and just superior to the urethral meatus (chronic)  BUS: Normal  Vagina: Normal estrogen effect; no significant discharge  Cervix: Normal; parous; no  cervical motion tenderness  Uterus: Normal; anteverted, normal size and shape, mobile, nontender  Adnexa: Normal; nonpalpable nontender  RV: External hemorrhoids, nonthrombosed, No Rectal Masses and Normal Sphincter tone  MUSCULOSKELETAL: Normal range of motion. No tenderness.  No cyanosis, clubbing, or edema.  2+ DP/PT pulses. LYMPHATIC: No Axillary, Supraclavicular, cervical or Inguinal Adenopathy.    Assessment:   Annual gynecologic examination 47 y.o. Contraception: tubal ligation Normal BMI Abnormal uterine bleeding; benign  endometrial biopsy and normal ultrasound Perineal cyst, 1 cm, midline, sub-clitoral/supra urethral, asymptomatic History of abnormal uterine bleeding, normalized after Depo-Provera injection 1 year ago Tobacco user, currently smoking less than 1/2 pack of cigarettes a day Plan:  Pap: pap/hpv Mammogram: Ordered Stool Guaiac Testing:  Not Indicated Labs: thru pcp Routine preventative health maintenance measures emphasized: Exercise/Diet/Weight control, Tobacco Warnings and Alcohol/Substance use risks Smoking cessation strongly encouraged Return to Clinic - 1 Year-Dr. Francisca December, CMA    I Hulan Saas  am acting as a Neurosurgeon for [employer].  I have reviewed, updated, and concur with information scribed by Darol Destine, CMA Herold Harms, MD  Note: This dictation was prepared with Dragon dictation along with smaller phrase technology. Any transcriptional errors that result from this process are unintentional.

## 2017-12-23 ENCOUNTER — Ambulatory Visit: Payer: Medicaid Other | Admitting: Obstetrics and Gynecology

## 2017-12-23 ENCOUNTER — Other Ambulatory Visit (HOSPITAL_COMMUNITY)
Admission: RE | Admit: 2017-12-23 | Discharge: 2017-12-23 | Disposition: A | Discharge status: Home / Self Care | Payer: Medicaid Other | Source: Ambulatory Visit | Attending: Obstetrics and Gynecology | Admitting: Obstetrics and Gynecology

## 2017-12-23 ENCOUNTER — Encounter: Payer: Self-pay | Admitting: Obstetrics and Gynecology

## 2017-12-23 VITALS — BP 100/62 | HR 80 | Ht 64.0 in | Wt 148.0 lb

## 2017-12-23 DIAGNOSIS — Z720 Tobacco use: Secondary | ICD-10-CM | POA: Diagnosis not present

## 2017-12-23 DIAGNOSIS — Z01419 Encounter for gynecological examination (general) (routine) without abnormal findings: Secondary | ICD-10-CM | POA: Diagnosis not present

## 2017-12-23 DIAGNOSIS — Z87410 Personal history of cervical dysplasia: Secondary | ICD-10-CM | POA: Diagnosis not present

## 2017-12-23 DIAGNOSIS — R768 Other specified abnormal immunological findings in serum: Secondary | ICD-10-CM

## 2017-12-23 DIAGNOSIS — Z9851 Tubal ligation status: Secondary | ICD-10-CM

## 2017-12-23 DIAGNOSIS — Z1239 Encounter for other screening for malignant neoplasm of breast: Secondary | ICD-10-CM | POA: Diagnosis not present

## 2017-12-23 DIAGNOSIS — N9089 Other specified noninflammatory disorders of vulva and perineum: Secondary | ICD-10-CM

## 2017-12-23 DIAGNOSIS — Z0000 Encounter for general adult medical examination without abnormal findings: Secondary | ICD-10-CM

## 2017-12-23 NOTE — Patient Instructions (Addendum)
1.  Pap smear is done. 2.  Mammogram is ordered. 3.  Screening labs are to be done through primary care 4.  Continue with healthy eating and exercise. 5.  Recommend ibuprofen as needed for control of dysmenorrhea 6.  Return in 1 year for annual exam-Dr. Ionia Maintenance, Female Adopting a healthy lifestyle and getting preventive care can go a long way to promote health and wellness. Talk with your health care provider about what schedule of regular examinations is right for you. This is a good chance for you to check in with your provider about disease prevention and staying healthy. In between checkups, there are plenty of things you can do on your own. Experts have done a lot of research about which lifestyle changes and preventive measures are most likely to keep you healthy. Ask your health care provider for more information. Weight and diet Eat a healthy diet  Be sure to include plenty of vegetables, fruits, low-fat dairy products, and lean protein.  Do not eat a lot of foods high in solid fats, added sugars, or salt.  Get regular exercise. This is one of the most important things you can do for your health. ? Most adults should exercise for at least 150 minutes each week. The exercise should increase your heart rate and make you sweat (moderate-intensity exercise). ? Most adults should also do strengthening exercises at least twice a week. This is in addition to the moderate-intensity exercise.  Maintain a healthy weight  Body mass index (BMI) is a measurement that can be used to identify possible weight problems. It estimates body fat based on height and weight. Your health care provider can help determine your BMI and help you achieve or maintain a healthy weight.  For females 51 years of age and older: ? A BMI below 18.5 is considered underweight. ? A BMI of 18.5 to 24.9 is normal. ? A BMI of 25 to 29.9 is considered overweight. ? A BMI of 30 and above is considered  obese.  Watch levels of cholesterol and blood lipids  You should start having your blood tested for lipids and cholesterol at 45 years of age, then have this test every 5 years.  You may need to have your cholesterol levels checked more often if: ? Your lipid or cholesterol levels are high. ? You are older than 45 years of age. ? You are at high risk for heart disease.  Cancer screening Lung Cancer  Lung cancer screening is recommended for adults 42-93 years old who are at high risk for lung cancer because of a history of smoking.  A yearly low-dose CT scan of the lungs is recommended for people who: ? Currently smoke. ? Have quit within the past 15 years. ? Have at least a 30-pack-year history of smoking. A pack year is smoking an average of one pack of cigarettes a day for 1 year.  Yearly screening should continue until it has been 15 years since you quit.  Yearly screening should stop if you develop a health problem that would prevent you from having lung cancer treatment.  Breast Cancer  Practice breast self-awareness. This means understanding how your breasts normally appear and feel.  It also means doing regular breast self-exams. Let your health care provider know about any changes, no matter how small.  If you are in your 20s or 30s, you should have a clinical breast exam (CBE) by a health care provider every 1-3 years as part  of a regular health exam.  If you are 30 or older, have a CBE every year. Also consider having a breast X-ray (mammogram) every year.  If you have a family history of breast cancer, talk to your health care provider about genetic screening.  If you are at high risk for breast cancer, talk to your health care provider about having an MRI and a mammogram every year.  Breast cancer gene (BRCA) assessment is recommended for women who have family members with BRCA-related cancers. BRCA-related cancers  include: ? Breast. ? Ovarian. ? Tubal. ? Peritoneal cancers.  Results of the assessment will determine the need for genetic counseling and BRCA1 and BRCA2 testing.  Cervical Cancer Your health care provider may recommend that you be screened regularly for cancer of the pelvic organs (ovaries, uterus, and vagina). This screening involves a pelvic examination, including checking for microscopic changes to the surface of your cervix (Pap test). You may be encouraged to have this screening done every 3 years, beginning at age 70.  For women ages 17-65, health care providers may recommend pelvic exams and Pap testing every 3 years, or they may recommend the Pap and pelvic exam, combined with testing for human papilloma virus (HPV), every 5 years. Some types of HPV increase your risk of cervical cancer. Testing for HPV may also be done on women of any age with unclear Pap test results.  Other health care providers may not recommend any screening for nonpregnant women who are considered low risk for pelvic cancer and who do not have symptoms. Ask your health care provider if a screening pelvic exam is right for you.  If you have had past treatment for cervical cancer or a condition that could lead to cancer, you need Pap tests and screening for cancer for at least 20 years after your treatment. If Pap tests have been discontinued, your risk factors (such as having a new sexual partner) need to be reassessed to determine if screening should resume. Some women have medical problems that increase the chance of getting cervical cancer. In these cases, your health care provider may recommend more frequent screening and Pap tests.  Colorectal Cancer  This type of cancer can be detected and often prevented.  Routine colorectal cancer screening usually begins at 45 years of age and continues through 45 years of age.  Your health care provider may recommend screening at an earlier age if you have risk factors  for colon cancer.  Your health care provider may also recommend using home test kits to check for hidden blood in the stool.  A small camera at the end of a tube can be used to examine your colon directly (sigmoidoscopy or colonoscopy). This is done to check for the earliest forms of colorectal cancer.  Routine screening usually begins at age 85.  Direct examination of the colon should be repeated every 5-10 years through 45 years of age. However, you may need to be screened more often if early forms of precancerous polyps or small growths are found.  Skin Cancer  Check your skin from head to toe regularly.  Tell your health care provider about any new moles or changes in moles, especially if there is a change in a mole's shape or color.  Also tell your health care provider if you have a mole that is larger than the size of a pencil eraser.  Always use sunscreen. Apply sunscreen liberally and repeatedly throughout the day.  Protect yourself by wearing long  sleeves, pants, a wide-brimmed hat, and sunglasses whenever you are outside.  Heart disease, diabetes, and high blood pressure  High blood pressure causes heart disease and increases the risk of stroke. High blood pressure is more likely to develop in: ? People who have blood pressure in the high end of the normal range (130-139/85-89 mm Hg). ? People who are overweight or obese. ? People who are African American.  If you are 93-79 years of age, have your blood pressure checked every 3-5 years. If you are 85 years of age or older, have your blood pressure checked every year. You should have your blood pressure measured twice-once when you are at a hospital or clinic, and once when you are not at a hospital or clinic. Record the average of the two measurements. To check your blood pressure when you are not at a hospital or clinic, you can use: ? An automated blood pressure machine at a pharmacy. ? A home blood pressure monitor.  If  you are between 74 years and 26 years old, ask your health care provider if you should take aspirin to prevent strokes.  Have regular diabetes screenings. This involves taking a blood sample to check your fasting blood sugar level. ? If you are at a normal weight and have a low risk for diabetes, have this test once every three years after 45 years of age. ? If you are overweight and have a high risk for diabetes, consider being tested at a younger age or more often. Preventing infection Hepatitis B  If you have a higher risk for hepatitis B, you should be screened for this virus. You are considered at high risk for hepatitis B if: ? You were born in a country where hepatitis B is common. Ask your health care provider which countries are considered high risk. ? Your parents were born in a high-risk country, and you have not been immunized against hepatitis B (hepatitis B vaccine). ? You have HIV or AIDS. ? You use needles to inject street drugs. ? You live with someone who has hepatitis B. ? You have had sex with someone who has hepatitis B. ? You get hemodialysis treatment. ? You take certain medicines for conditions, including cancer, organ transplantation, and autoimmune conditions.  Hepatitis C  Blood testing is recommended for: ? Everyone born from [birth year] through 1963. ? Anyone with known risk factors for hepatitis C.  Sexually transmitted infections (STIs)  You should be screened for sexually transmitted infections (STIs) including gonorrhea and chlamydia if: ? You are sexually active and are younger than 45 years of age. ? You are older than 45 years of age and your health care provider tells you that you are at risk for this type of infection. ? Your sexual activity has changed since you were last screened and you are at an increased risk for chlamydia or gonorrhea. Ask your health care provider if you are at risk.  If you do not have HIV, but are at risk, it may be recommended  that you take a prescription medicine daily to prevent HIV infection. This is called pre-exposure prophylaxis (PrEP). You are considered at risk if: ? You are sexually active and do not regularly use condoms or know the HIV status of your partner(s). ? You take drugs by injection. ? You are sexually active with a partner who has HIV.  Talk with your health care provider about whether you are at high risk of being infected with HIV. If  you choose to begin PrEP, you should first be tested for HIV. You should then be tested every 3 months for as long as you are taking PrEP. Pregnancy  If you are premenopausal and you may become pregnant, ask your health care provider about preconception counseling.  If you may become pregnant, take 400 to 800 micrograms (mcg) of folic acid every day.  If you want to prevent pregnancy, talk to your health care provider about birth control (contraception). Osteoporosis and menopause  Osteoporosis is a disease in which the bones lose minerals and strength with aging. This can result in serious bone fractures. Your risk for osteoporosis can be identified using a bone density scan.  If you are 28 years of age or older, or if you are at risk for osteoporosis and fractures, ask your health care provider if you should be screened.  Ask your health care provider whether you should take a calcium or vitamin D supplement to lower your risk for osteoporosis.  Menopause may have certain physical symptoms and risks.  Hormone replacement therapy may reduce some of these symptoms and risks. Talk to your health care provider about whether hormone replacement therapy is right for you. Follow these instructions at home:  Schedule regular health, dental, and eye exams.  Stay current with your immunizations.  Do not use any tobacco products including cigarettes, chewing tobacco, or electronic cigarettes.  If you are pregnant, do not drink alcohol.  If you are  breastfeeding, limit how much and how often you drink alcohol.  Limit alcohol intake to no more than 1 drink per day for nonpregnant women. One drink equals 12 ounces of beer, 5 ounces of wine, or 1 ounces of hard liquor.  Do not use street drugs.  Do not share needles.  Ask your health care provider for help if you need support or information about quitting drugs.  Tell your health care provider if you often feel depressed.  Tell your health care provider if you have ever been abused or do not feel safe at home. This information is not intended to replace advice given to you by your health care provider. Make sure you discuss any questions you have with your health care provider. Document Released: 08/19/2010 Document Revised: 07/12/2015 Document Reviewed: 11/07/2014 Elsevier Interactive Patient Education  [employer].

## 2017-12-24 ENCOUNTER — Other Ambulatory Visit: Payer: Self-pay

## 2017-12-24 ENCOUNTER — Ambulatory Visit
Admission: EM | Admit: 2017-12-24 | Discharge: 2017-12-24 | Disposition: A | Discharge status: Home / Self Care | Payer: No Typology Code available for payment source | Attending: Family Medicine | Admitting: Family Medicine

## 2017-12-24 ENCOUNTER — Encounter: Payer: Self-pay | Admitting: Emergency Medicine

## 2017-12-24 DIAGNOSIS — M7918 Myalgia, other site: Secondary | ICD-10-CM | POA: Diagnosis not present

## 2017-12-24 MED ORDER — METAXALONE 800 MG PO TABS: 800.0000 mg | ORAL_TABLET | Freq: Three times a day (TID) | ORAL | 0 refills | Status: DC | PRN

## 2017-12-24 MED ORDER — MELOXICAM 15 MG PO TABS: 15.0000 mg | ORAL_TABLET | Freq: Every day | ORAL | 0 refills | Status: DC | PRN
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
## 2017-12-24 NOTE — Discharge Instructions (Signed)
Rest.  ° °Medications as needed. ° °Take care ° °Dr. Nakya Weyand  °

## 2017-12-25 LAB — CYTOLOGY - PAP
Diagnosis: NEGATIVE
HPV: NOT DETECTED

## 2017-12-28 ENCOUNTER — Ambulatory Visit
Admission: EM | Admit: 2017-12-28 | Discharge: 2017-12-28 | Disposition: A | Discharge status: Home / Self Care | Payer: Medicaid Other | Attending: Family Medicine | Admitting: Family Medicine

## 2017-12-28 ENCOUNTER — Other Ambulatory Visit: Payer: Self-pay

## 2017-12-28 ENCOUNTER — Ambulatory Visit: Payer: Medicaid Other

## 2017-12-28 ENCOUNTER — Encounter: Payer: Self-pay | Admitting: Emergency Medicine

## 2017-12-28 DIAGNOSIS — M25512 Pain in left shoulder: Secondary | ICD-10-CM

## 2017-12-28 DIAGNOSIS — M5030 Other cervical disc degeneration, unspecified cervical region: Secondary | ICD-10-CM | POA: Insufficient documentation

## 2017-12-28 DIAGNOSIS — S46812D Strain of other muscles, fascia and tendons at shoulder and upper arm level, left arm, subsequent encounter: Secondary | ICD-10-CM

## 2017-12-28 DIAGNOSIS — Y92410 Unspecified street and highway as the place of occurrence of the external cause: Secondary | ICD-10-CM | POA: Diagnosis not present

## 2017-12-28 DIAGNOSIS — S46812A Strain of other muscles, fascia and tendons at shoulder and upper arm level, left arm, initial encounter: Secondary | ICD-10-CM

## 2017-12-28 DIAGNOSIS — F17210 Nicotine dependence, cigarettes, uncomplicated: Secondary | ICD-10-CM | POA: Insufficient documentation

## 2017-12-28 DIAGNOSIS — Z79899 Other long term (current) drug therapy: Secondary | ICD-10-CM | POA: Diagnosis not present

## 2017-12-28 MED ORDER — TRAMADOL HCL 50 MG PO TABS: 50.0000 mg | ORAL_TABLET | Freq: Three times a day (TID) | ORAL | 0 refills | Status: DC | PRN

## 2017-12-28 NOTE — Discharge Instructions (Addendum)
Take medication as prescribed. Take mobic. Rest. Drink plenty of fluids. Stretch. Range of motion exercises.   Follow-up with orthopedic this week as discussed.  Follow up with your primary care physician this week as needed. Return to Urgent care for new or worsening concerns.
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
## 2017-12-30 ENCOUNTER — Telehealth: Payer: Self-pay | Admitting: Obstetrics and Gynecology

## 2017-12-30 NOTE — Telephone Encounter (Signed)
[facility]. No other information was disclosed. Please advise.

## 2017-12-30 NOTE — Telephone Encounter (Signed)
Pt aware pap neg/neg.

## 2017-12-30 NOTE — Telephone Encounter (Signed)
Wireless customer is not available.  Will try later.

## 2018-01-08 ENCOUNTER — Ambulatory Visit: Payer: Medicaid Other | Admitting: Obstetrics and Gynecology

## 2018-01-08 ENCOUNTER — Other Ambulatory Visit (HOSPITAL_COMMUNITY)
Admission: RE | Admit: 2018-01-08 | Discharge: 2018-01-08 | Disposition: A | Discharge status: Home / Self Care | Payer: Medicaid Other | Source: Ambulatory Visit | Attending: Obstetrics and Gynecology | Admitting: Obstetrics and Gynecology

## 2018-01-08 ENCOUNTER — Encounter: Payer: Self-pay | Admitting: Obstetrics and Gynecology

## 2018-01-08 VITALS — BP 128/82 | HR 92 | Ht 64.0 in | Wt 149.0 lb

## 2018-01-08 DIAGNOSIS — R300 Dysuria: Secondary | ICD-10-CM

## 2018-01-08 DIAGNOSIS — B9689 Other specified bacterial agents as the cause of diseases classified elsewhere: Secondary | ICD-10-CM

## 2018-01-08 DIAGNOSIS — N760 Acute vaginitis: Secondary | ICD-10-CM | POA: Diagnosis not present

## 2018-01-08 DIAGNOSIS — N898 Other specified noninflammatory disorders of vagina: Secondary | ICD-10-CM | POA: Diagnosis present

## 2018-01-08 DIAGNOSIS — N3001 Acute cystitis with hematuria: Secondary | ICD-10-CM

## 2018-01-08 LAB — POCT URINALYSIS DIPSTICK
Bilirubin, UA: NEGATIVE
Glucose, UA: NEGATIVE
Ketones, UA: NEGATIVE
LEUKOCYTES UA: NEGATIVE
NITRITE UA: NEGATIVE
Protein, UA: NEGATIVE
SPEC GRAV UA: 1.025 (ref 1.010–1.025)
UROBILINOGEN UA: 0.2 U/dL
pH, UA: 5 (ref 5.0–8.0)

## 2018-01-08 MED ORDER — METRONIDAZOLE 500 MG PO TABS: 500.0000 mg | ORAL_TABLET | Freq: Two times a day (BID) | ORAL | 0 refills | Status: AC

## 2018-01-08 MED ORDER — NITROFURANTOIN MONOHYD MACRO 100 MG PO CAPS: 100.0000 mg | ORAL_CAPSULE | Freq: Two times a day (BID) | ORAL | 1 refills | Status: DC

## 2018-01-08 NOTE — Progress Notes (Signed)
HPI:      Theresa Ryan is a 47 y.o. [id number] who LMP was Patient's last menstrual period was 12/26/2017.  Subjective:   She presents today with concerns regarding the fidelity of her new sexual partner.  (She was previously diagnosed with trichomonas from a different partner and it "took her by surprise ") She also complains of pelvic pressure symptoms which have been present for about a week. Upon discussing the matter with her she basically would like to be checked out for vaginal STDs and other forms of vaginitis.    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 She does not have any pertinent problems on file. She  has a past surgical history that includes Back surgery; Cholecystectomy; Cesarean section; Cryotherapy; Cervical biopsy w/ loop electrode excision; Tubal ligation (2006); Dilation and curettage of uterus; and laparoscopy. Her family history includes Colon cancer in her maternal grandmother; Diabetes in her daughter and mother; Prostate cancer in her father. She  reports that she has been smoking cigarettes. She has been smoking about 0.50 packs per day. She has never used smokeless tobacco. She reports that she does not drink alcohol or use drugs. She has a current medication list which includes the following prescription(s): metronidazole and nitrofurantoin (macrocrystal-monohydrate). She is allergic to vicoprofen [hydrocodone-ibuprofen].       Review of Systems:  Review of Systems  Constitutional: Denied constitutional symptoms, night sweats, recent illness, fatigue, fever, insomnia and weight loss.  Eyes: Denied eye symptoms, eye pain, photophobia, vision change and  visual disturbance.  Ears/Nose/Throat/Neck: Denied ear, nose, throat or neck symptoms, hearing loss, nasal discharge, sinus congestion and sore throat.  Cardiovascular: Denied cardiovascular symptoms, arrhythmia, chest pain/pressure, edema, exercise intolerance, orthopnea and palpitations.  Respiratory: Denied pulmonary symptoms, asthma, pleuritic pain, productive sputum, cough, dyspnea and wheezing.  Gastrointestinal: Denied, gastro-esophageal reflux, melena, nausea and vomiting.  Genitourinary: See HPI for additional information.  Musculoskeletal: Denied musculoskeletal symptoms, stiffness, swelling, muscle weakness and myalgia.  Dermatologic: Denied dermatology symptoms, rash and scar.  Neurologic: Denied neurology symptoms, dizziness, headache, neck pain and syncope.  Psychiatric: Denied psychiatric symptoms, anxiety and depression.  Endocrine: Denied endocrine symptoms including hot flashes and night sweats.   Meds:   No current outpatient medications on file prior to visit.   No current facility-administered medications on file prior to visit.     Objective:     Vitals:   01/08/18 0919  BP: 128/82  Pulse: 92              UA consistent with UTI-hematuria present. Physical examination   Pelvic:   Vulva: Normal appearance.  No lesions.  Vagina: No lesions or abnormalities noted.  Support: Normal pelvic support.  Urethra No masses tenderness or scarring.  Meatus Normal size without lesions or prolapse.  Cervix: Normal appearance.  No lesions.  Anus: Normal exam.  No lesions.  Perineum: Normal exam.  No lesions.        Bimanual   Uterus: Normal size.  Non-tender.  Mobile.  AV.  Adnexae: No masses.  Non-tender to palpation.  Cul-de-sac: Negative for abnormality.   WET PREP: clue cells: present, KOH (yeast): negative, odor: present  and trichomoniasis: negative Ph:  > 4.5   Assessment:    [id number] Patient Active Problem List   Diagnosis Date Noted  . HSV-2  seropositive 11/27/2016  . 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 STD exposure 11/26/2016  . Abnormal uterine bleeding (AUB) 10/22/2016  . Pelvic pain 10/22/2016  . History of cervical dysplasia 10/22/2016  . History of D&C 10/22/2016  . History of tubal ligation 10/22/2016  . Tobacco user 10/22/2016     1. Acute cystitis with hematuria   2. Dysuria   3. Vaginal discharge   4. Bacterial vulvovaginitis     The patient's symptoms can be explained by a UTI.  She was also found to have BV at this visit.   Plan:            1.  GC/CT testing performed-will contact patient with results.  2.  Treat UTI with Macrobid.  3.  Treat BV with Flagyl. Orders Orders Placed This Encounter  Procedures  . POCT urinalysis dipstick     Meds ordered this encounter  Medications  . nitrofurantoin, macrocrystal-monohydrate, (MACROBID) 100 MG capsule    Sig: Take 1 capsule (100 mg total) by mouth 2 (two) times daily.    Dispense:  14 capsule    Refill:  1  . metroNIDAZOLE (FLAGYL) 500 MG tablet    Sig: Take 1 tablet (500 mg total) by mouth 2 (two) times daily for 7 days.    Dispense:  14 tablet    Refill:  0      F/U  Return for We will contact her with any abnormal test results. I spent 18 minutes involved in the care of this patient of which greater than 50% was spent discussing forms of vaginal STDs, urinary tract infection work-up and treatment, bacterial vaginosis.  All questions answered.  Elonda Husky, M.D. 01/08/2018 11:03 AM

## 2018-01-11 LAB — CERVICOVAGINAL ANCILLARY ONLY
Chlamydia: NEGATIVE
NEISSERIA GONORRHEA: NEGATIVE
TRICH (WINDOWPATH): NEGATIVE

## 2018-02-22 ENCOUNTER — Ambulatory Visit
Admission: EM | Admit: 2018-02-22 | Discharge: 2018-02-22 | Disposition: A | Discharge status: Home / Self Care | Payer: Medicaid Other | Attending: Family Medicine | Admitting: Family Medicine

## 2018-02-22 ENCOUNTER — Ambulatory Visit: Payer: Medicaid Other

## 2018-02-22 ENCOUNTER — Encounter: Payer: Self-pay | Admitting: Emergency Medicine

## 2018-02-22 ENCOUNTER — Other Ambulatory Visit: Payer: Self-pay

## 2018-02-22 DIAGNOSIS — R05 Cough: Secondary | ICD-10-CM | POA: Diagnosis present

## 2018-02-22 DIAGNOSIS — F17210 Nicotine dependence, cigarettes, uncomplicated: Secondary | ICD-10-CM

## 2018-02-22 DIAGNOSIS — R053 Chronic cough: Secondary | ICD-10-CM

## 2018-02-22 MED ORDER — BENZONATATE 100 MG PO CAPS: 100.0000 mg | ORAL_CAPSULE | Freq: Three times a day (TID) | ORAL | 0 refills | Status: DC | PRN

## 2018-02-22 MED ORDER — BUDESONIDE-FORMOTEROL FUMARATE 160-4.5 MCG/ACT IN AERO: 2.0000 | INHALATION_SPRAY | Freq: Two times a day (BID) | RESPIRATORY_TRACT | 1 refills | Status: DC

## 2018-02-22 MED ORDER — ALBUTEROL SULFATE HFA 108 (90 BASE) MCG/ACT IN AERS: 1.0000 | INHALATION_SPRAY | Freq: Four times a day (QID) | RESPIRATORY_TRACT | 1 refills | Status: DC | PRN

## 2018-02-22 NOTE — ED Triage Notes (Signed)
Patient c/o productive cough x 1 month. Patient states she has coughing spells at night and during the day. She does endorse some shortness of breath with exertion. She is concerned for pneumonia and is requesting a CXR.

## 2018-02-22 NOTE — ED Provider Notes (Signed)
[facility]    CSN: [id number] Arrival date & time: 02/22/18  1255  History   Chief Complaint Chief Complaint  Patient presents with  . Cough   HPI  48 year old female presents with persistent cough.  Patient reports cough for the past 1.5 months.  Productive.  Is severe at times.  Associated shortness of breath particularly with exertion.  No documented fever.  Patient concerned about pneumonia and sick contacts with pneumonia.  No known exacerbating factors.  Patient continues to smoke.  No other associated symptoms.  No other complaints or concerns at this time.  PMH, Surgical Hx, Family Hx, Social History reviewed and updated as below.  Past Medical History:  Diagnosis Date  . Abnormal Pap smear of cervix   . DDD (degenerative disc disease), cervical   . 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 STD exposure 11/26/2016  . Abnormal uterine bleeding (AUB) 10/22/2016  . Pelvic pain 10/22/2016  . History of cervical dysplasia 10/22/2016  . History of D&C 10/22/2016  . History of tubal ligation 10/22/2016  . Tobacco user 10/22/2016    Past Surgical History:  Procedure Laterality Date  . BACK SURGERY    . CERVICAL BIOPSY  W/ LOOP ELECTRODE EXCISION    . CESAREAN SECTION    . CHOLECYSTECTOMY    . CRYOTHERAPY    . DILATION AND CURETTAGE OF UTERUS    . LAPAROSCOPY    . TUBAL LIGATION  2006    OB History    Gravida  6   Para  4   Term  2   Preterm  2   AB  2   Living  3     SAB      TAB  2   Ectopic      Multiple      Live Births  3            Home Medications    Prior to Admission medications   Medication Sig Start Date End Date Taking?  Authorizing Provider  albuterol (PROVENTIL HFA;VENTOLIN HFA) 108 (90 Base) MCG/ACT inhaler Inhale 1-2 puffs into the lungs every 6 (six) hours as needed for wheezing or shortness of breath. 02/22/18   Tommie Sams, DO  benzonatate (TESSALON) 100 MG capsule Take 1 capsule (100 mg total) by mouth 3 (three) times daily as needed. 02/22/18   Tommie Sams, DO  budesonide-formoterol (SYMBICORT) 160-4.5 MCG/ACT inhaler Inhale 2 puffs into the lungs 2 (two) times daily. 02/22/18   Tommie Sams, DO    Family History Family History  Problem Relation Age of Onset  . Diabetes Mother   . Colon cancer Maternal Grandmother   . Diabetes Daughter   . Prostate cancer Father   . Breast cancer Neg Hx   . Ovarian cancer Neg Hx     Social History Social History   Tobacco Use  . Smoking status: Current Every Day Smoker    Packs/day: 0.50    Types: Cigarettes  . Smokeless tobacco: Never Used  Substance Use Topics  . Alcohol use: No  . Drug use: No     Allergies   Vicoprofen [hydrocodone-ibuprofen]   Review of Systems Review  of Systems  Constitutional: Negative for fever.  Respiratory: Positive for cough and shortness of breath.    Physical Exam Triage Vital Signs ED Triage Vitals  Enc Vitals Group     BP 02/22/18 1319 119/88     Pulse Rate 02/22/18 1319 74     Resp 02/22/18 1319 20     Temp 02/22/18 1319 98.1 F (36.7 C)     Temp Source 02/22/18 1319 Oral     SpO2 02/22/18 1319 100 %     Weight 02/22/18 1317 145 lb (65.8 kg)     Height 02/22/18 1317 5\' 4"  (1.626 m)     Head Circumference --      Peak Flow --      Pain Score 02/22/18 1316 0     Pain Loc --      Pain Edu? --      Excl. in GC? --    Updated Vital Signs BP 119/88 (BP Location: Right Arm)   Pulse 74   Temp 98.1 F (36.7 C) (Oral)   Resp 20   Ht 5\' 4"  (1.626 m)   Wt 65.8 kg   LMP 02/10/2018   SpO2 100%   BMI 24.89 kg/m   Visual Acuity Right Eye Distance:   Left Eye Distance:   Bilateral Distance:    Right  Eye Near:   Left Eye Near:    Bilateral Near:     Physical Exam Vitals signs and nursing note reviewed.  Constitutional:      General: She is not in acute distress. HENT:     Head: Normocephalic and atraumatic.     Right Ear: Tympanic membrane normal.     Left Ear: Tympanic membrane normal.     Mouth/Throat:     Pharynx: Oropharynx is clear. No posterior oropharyngeal erythema.  Cardiovascular:     Rate and Rhythm: Normal rate and regular rhythm.  Pulmonary:     Effort: Pulmonary effort is normal.     Breath sounds: No wheezing or rales.  Neurological:     Mental Status: She is alert.  Psychiatric:        Mood and Affect: Mood normal.        Behavior: Behavior normal.    UC Treatments / Results  Labs (all labs ordered are listed, but only abnormal results are displayed) Labs Reviewed - No data to display  EKG None  Radiology Dg Chest 2 View  Result Date: 02/22/2018 CLINICAL DATA:  Productive cough for 1 month EXAM: CHEST - 2 VIEW COMPARISON:  08/06/2017 FINDINGS: The heart size and mediastinal contours are within normal limits. Both lungs are clear. The visualized skeletal structures are unremarkable. IMPRESSION: Normal chest. Electronically Signed   By: Deatra Robinson M.D.   On: 02/22/2018 13:39    Procedures Procedures (including critical care time)  Medications Ordered in UC Medications - No data to display  Initial Impression / Assessment and Plan / UC Course  I have reviewed the triage vital signs and the nursing notes.  Pertinent labs & imaging results that were available during my care of the patient were reviewed by me and considered in my medical decision making (see chart for details).    48 year old female presents with chronic cough.  Chest x-ray negative.  Suspect underlying chronic bronchitis/COPD.  Placing albuterol, Symbicort.  Tessalon Perles for cough.  Final Clinical Impressions(s) / UC Diagnoses   Final diagnoses:  Chronic cough      Discharge Instructions  PCP recommendations: [facility], [facility], [facility], [facility].  Medications as prescribed.  Take care  Dr. Adriana Simasook    ED Prescriptions    Medication Sig Dispense Auth. Provider   albuterol (PROVENTIL HFA;VENTOLIN HFA) 108 (90 Base) MCG/ACT inhaler Inhale 1-2 puffs into the lungs every 6 (six) hours as needed for wheezing or shortness of breath. 1 Inhaler Govind Furey G, DO   budesonide-formoterol (SYMBICORT) 160-4.5 MCG/ACT inhaler Inhale 2 puffs into the lungs 2 (two) times daily. 1 Inhaler Aidan Moten G, DO   benzonatate (TESSALON) 100 MG capsule Take 1 capsule (100 mg total) by mouth 3 (three) times daily as needed. 30 capsule Tommie Samsook, Shabree Tebbetts G, DO     Controlled Substance Prescriptions [place] Controlled Substance Registry consulted? Not Applicable   Tommie SamsCook, Rhyse Skowron G, DO 02/22/18 2049

## 2018-02-22 NOTE — Discharge Instructions (Signed)
PCP recommendations: [facility], [facility], [facility], [facility].  Medications as prescribed.  Take care  Dr. Adriana Simas

## 2018-12-27 NOTE — Progress Notes (Signed)
Pt is present for annual exam. Pt stated that she was doing well. Pt stated wanting to have STD screening due to past diagnosis of STD.

## 2018-12-27 NOTE — Patient Instructions (Addendum)
Preventive Care 40-46 Years Old, Female Preventive care refers to visits with your health care provider and lifestyle choices that can promote health and wellness. This includes:  A yearly physical exam. This may also be called an annual well check.  Regular dental visits and eye exams.  Immunizations.  Screening for certain conditions.  Healthy lifestyle choices, such as eating a healthy diet, getting regular exercise, not using drugs or products that contain nicotine and tobacco, and limiting alcohol use. What can I expect for my preventive care visit? Physical exam Your health care provider will check your:  Height and weight. This may be used to calculate body mass index (BMI), which tells if you are at a healthy weight.  Heart rate and blood pressure.  Skin for abnormal spots. Counseling Your health care provider may ask you questions about your:  Alcohol, tobacco, and drug use.  Emotional well-being.  Home and relationship well-being.  Sexual activity.  Eating habits.  Work and work environment.  Method of birth control.  Menstrual cycle.  Pregnancy history. What immunizations do I need?  Influenza (flu) vaccine  This is recommended every year. Tetanus, diphtheria, and pertussis (Tdap) vaccine  You may need a Td booster every 10 years. Varicella (chickenpox) vaccine  You may need this if you have not been vaccinated. Zoster (shingles) vaccine  You may need this after age 60. Measles, mumps, and rubella (MMR) vaccine  You may need at least one dose of MMR if you were born in 1957 or later. You may also need a second dose. Pneumococcal conjugate (PCV13) vaccine  You may need this if you have certain conditions and were not previously vaccinated. Pneumococcal polysaccharide (PPSV23) vaccine  You may need one or two doses if you smoke cigarettes or if you have certain conditions. Meningococcal conjugate (MenACWY) vaccine  You may need this if you  have certain conditions. Hepatitis A vaccine  You may need this if you have certain conditions or if you travel or work in places where you may be exposed to hepatitis A. Hepatitis B vaccine  You may need this if you have certain conditions or if you travel or work in places where you may be exposed to hepatitis B. Haemophilus influenzae type b (Hib) vaccine  You may need this if you have certain conditions. Human papillomavirus (HPV) vaccine  If recommended by your health care provider, you may need three doses over 6 months. You may receive vaccines as individual doses or as more than one vaccine together in one shot (combination vaccines). Talk with your health care provider about the risks and benefits of combination vaccines. What tests do I need? Blood tests  Lipid and cholesterol levels. These may be checked every 5 years, or more frequently if you are over 50 years old.  Hepatitis C test.  Hepatitis B test. Screening  Lung cancer screening. You may have this screening every year starting at age 55 if you have a 30-pack-year history of smoking and currently smoke or have quit within the past 15 years.  Colorectal cancer screening. All adults should have this screening starting at age 50 and continuing until age 75. Your health care provider may recommend screening at age 45 if you are at increased risk. You will have tests every 1-10 years, depending on your results and the type of screening test.  Diabetes screening. This is done by checking your blood sugar (glucose) after you have not eaten for a while (fasting). You may have this   done every 1-3 years.  Mammogram. This may be done every 1-2 years. Talk with your health care provider about when you should start having regular mammograms. This may depend on whether you have a family history of breast cancer.  BRCA-related cancer screening. This may be done if you have a family history of breast, ovarian, tubal, or peritoneal  cancers.  Pelvic exam and Pap test. This may be done every 3 years starting at age 23. Starting at age 86, this may be done every 5 years if you have a Pap test in combination with an HPV test. Other tests  Sexually transmitted disease (STD) testing.  Bone density scan. This is done to screen for osteoporosis. You may have this scan if you are at high risk for osteoporosis. Follow these instructions at home: Eating and drinking  Eat a diet that includes fresh fruits and vegetables, whole grains, lean protein, and low-fat dairy.  Take vitamin and mineral supplements as recommended by your health care provider.  Do not drink alcohol if: ? Your health care provider tells you not to drink. ? You are pregnant, may be pregnant, or are planning to become pregnant.  If you drink alcohol: ? Limit how much you have to 0-1 drink a day. ? Be aware of how much alcohol is in your drink. In the [place] one drink equals one 12 oz bottle of beer (355 mL), one 5 oz glass of wine (148 mL), or one 1 oz glass of hard liquor (44 mL). Lifestyle  Take daily care of your teeth and gums.  Stay active. Exercise for at least 30 minutes on 5 or more days each week.  Do not use any products that contain nicotine or tobacco, such as cigarettes, e-cigarettes, and chewing tobacco. If you need help quitting, ask your health care provider.  If you are sexually active, practice safe sex. Use a condom or other form of birth control (contraception) in order to prevent pregnancy and STIs (sexually transmitted infections).  If told by your health care provider, take low-dose aspirin daily starting at age 27. What's next?  Visit your health care provider once a year for a well check visit.  Ask your health care provider how often you should have your eyes and teeth checked.  Stay up to date on all vaccines. This information is not intended to replace advice given to you by your health care provider. Make sure you  discuss any questions you have with your health care provider. Document Released: 03/02/2015 Document Revised: 10/15/2017 Document Reviewed: 10/15/2017 Elsevier Patient Education  2020 [place] self-awareness is knowing how your breasts look and feel. Doing breast self-awareness is important. It allows you to catch a breast problem early while it is still small and can be treated. All women should do breast self-awareness, including women who have had breast implants. Tell your doctor if you notice a change in your breasts. What you need:  A mirror.  A well-lit room. How to do a breast self-exam A breast self-exam is one way to learn what is normal for your breasts and to check for changes. To do a breast self-exam: Look for changes  1. Take off all the clothes above your waist. 2. Stand in front of a mirror in a room with good lighting. 3. Put your hands on your hips. 4. Push your hands down. 5. Look at your breasts and nipples in the mirror to see if one breast or nipple looks  different from the other. Check to see if: ? The shape of one breast is different. ? The size of one breast is different. ? There are wrinkles, dips, and bumps in one breast and not the other. 6. Look at each breast for changes in the skin, such as: ? Redness. ? Scaly areas. 7. Look for changes in your nipples, such as: ? Liquid around the nipples. ? Bleeding. ? Dimpling. ? Redness. ? A change in where the nipples are. Feel for changes  1. Lie on your back on the floor. 2. Feel each breast. To do this, follow these steps: ? Pick a breast to feel. ? Put the arm closest to that breast above your head. ? Use your other arm to feel the nipple area of your breast. Feel the area with the pads of your three middle fingers by making small circles with your fingers. For the first circle, press lightly. For the second circle, press harder. For the third circle, press even harder.  ? Keep making circles with your fingers at the different pressures as you move down your breast. Stop when you feel your ribs. ? Move your fingers a little toward the center of your body. ? Start making circles with your fingers again, this time going up until you reach your collarbone. ? Keep making up-and-down circles until you reach your armpit. Remember to keep using the three pressures. ? Feel the other breast in the same way. 3. Sit or stand in the tub or shower. 4. With soapy water on your skin, feel each breast the same way you did in step 2 when you were lying on the floor. Write down what you find Writing down what you find can help you remember what to tell your doctor. Write down:  What is normal for each breast.  Any changes you find in each breast, including: ? The kind of changes you find. ? Whether you have pain. ? Size and location of any lumps.  When you last had your menstrual period. General tips  Check your breasts every month.  If you are breastfeeding, the best time to check your breasts is after you feed your baby or after you use a breast pump.  If you get menstrual periods, the best time to check your breasts is 5-7 days after your menstrual period is over.  With time, you will become comfortable with the self-exam, and you will begin to know if there are changes in your breasts. Contact a doctor if you:  See a change in the shape or size of your breasts or nipples.  See a change in the skin of your breast or nipples, such as red or scaly skin.  Have fluid coming from your nipples that is not normal.  Find a lump or thick area that was not there before.  Have pain in your breasts.  Have any concerns about your breast health. Summary  Breast self-awareness includes looking for changes in your breasts, as well as feeling for changes within your breasts.  Breast self-awareness should be done in front of a mirror in a well-lit room.  You should  check your breasts every month. If you get menstrual periods, the best time to check your breasts is 5-7 days after your menstrual period is over.  Let your doctor know of any changes you see in your breasts, including changes in size, changes on the skin, pain or tenderness, or fluid from your nipples that is not normal. This  information is not intended to replace advice given to you by your health care provider. Make sure you discuss any questions you have with your health care provider. Document Released: 07/23/2007 Document Revised: 09/22/2017 Document Reviewed: 09/22/2017 Elsevier Patient Education  2020 [employer].    Steps to Quit Smoking Smoking tobacco is the leading cause of preventable death. It can affect almost every organ in the body. Smoking puts you and those around you at risk for developing many serious chronic diseases. Quitting smoking can be difficult, but it is one of the best things that you can do for your health. It is never too late to quit. How do I get ready to quit? When you decide to quit smoking, create a plan to help you succeed. Before you quit:  Pick a date to quit. Set a date within the next 2 weeks to give you time to prepare.  Write down the reasons why you are quitting. Keep this list in places where you will see it often.  Tell your family, friends, and co-workers that you are quitting. Support from your loved ones can make quitting easier.  Talk with your health care provider about your options for quitting smoking.  Find out what treatment options are covered by your health insurance.  Identify people, places, things, and activities that make you want to smoke (triggers). Avoid them. What first steps can I take to quit smoking?  Throw away all cigarettes at home, at work, and in your car.  Throw away smoking accessories, such as Scientist, research (medical).  Clean your car. Make sure to empty the ashtray.  Clean your home, including curtains and  carpets. What strategies can I use to quit smoking? Talk with your health care provider about combining strategies, such as taking medicines while you are also receiving in-person counseling. Using these two strategies together makes you more likely to succeed in quitting than if you used either strategy on its own.  If you are pregnant or breastfeeding, talk with your health care provider about finding counseling or other support strategies to quit smoking. Do not take medicine to help you quit smoking unless your health care provider tells you to do so. To quit smoking: Quit right away  Quit smoking completely, instead of gradually reducing how much you smoke over a period of time. Research shows that stopping smoking right away is more successful than gradually quitting.  Attend in-person counseling to help you build problem-solving skills. You are more likely to succeed in quitting if you attend counseling sessions regularly. Even short sessions of 10 minutes can be effective. Take medicine You may take medicines to help you quit smoking. Some medicines require a prescription and some you can purchase over-the-counter. Medicines may have nicotine in them to replace the nicotine in cigarettes. Medicines may:  Help to stop cravings.  Help to relieve withdrawal symptoms. Your health care provider may recommend:  Nicotine patches, gum, or lozenges.  Nicotine inhalers or sprays.  Non-nicotine medicine that is taken by mouth. Find resources Find resources and support systems that can help you to quit smoking and remain smoke-free after you quit. These resources are most helpful when you use them often. They include:  Online chats with a Social worker.  Telephone quitlines.  Printed Furniture conservator/restorer.  Support groups or group counseling.  Text messaging programs.  Mobile phone apps or applications. Use apps that can help you stick to your quit plan by providing reminders, tips, and  encouragement. There are  many free apps for mobile devices as well as websites. Examples include Quit Guide from the [employer] and smokefree.gov What things can I do to make it easier to quit?   Reach out to your family and friends for support and encouragement. Call telephone quitlines (1-800-QUIT-NOW), reach out to support groups, or work with a counselor for support.  Ask people who smoke to avoid smoking around you.  Avoid places that trigger you to smoke, such as bars, parties, or smoke-break areas at work.  Spend time with people who do not smoke.  Lessen the stress in your life. Stress can be a smoking trigger for some people. To lessen stress, try: ? Exercising regularly. ? Doing deep-breathing exercises. ? Doing yoga. ? Meditating. ? Performing a body scan. This involves closing your eyes, scanning your body from head to toe, and noticing which parts of your body are particularly tense. Try to relax the muscles in those areas. How will I feel when I quit smoking? Day 1 to 3 weeks Within the first 24 hours of quitting smoking, you may start to feel withdrawal symptoms. These symptoms are usually most noticeable 2-3 days after quitting, but they usually do not last for more than 2-3 weeks. You may experience these symptoms:  Mood swings.  Restlessness, anxiety, or irritability.  Trouble concentrating.  Dizziness.  Strong cravings for sugary foods and nicotine.  Mild weight gain.  Constipation.  Nausea.  Coughing or a sore throat.  Changes in how the medicines that you take for unrelated issues work in your body.  Depression.  Trouble sleeping (insomnia). Week 3 and afterward After the first 2-3 weeks of quitting, you may start to notice more positive results, such as:  Improved sense of smell and taste.  Decreased coughing and sore throat.  Slower heart rate.  Lower blood pressure.  Clearer skin.  The ability to breathe more easily.  Fewer sick days.  Quitting smoking can be very challenging. Do not get discouraged if you are not successful the first time. Some people need to make many attempts to quit before they achieve long-term success. Do your best to stick to your quit plan, and talk with your health care provider if you have any questions or concerns. Summary  Smoking tobacco is the leading cause of preventable death. Quitting smoking is one of the best things that you can do for your health.  When you decide to quit smoking, create a plan to help you succeed.  Quit smoking right away, not slowly over a period of time.  When you start quitting, seek help from your health care provider, family, or friends. This information is not intended to replace advice given to you by your health care provider. Make sure you discuss any questions you have with your health care provider. Document Released: 01/28/2001 Document Revised: 04/23/2018 Document Reviewed: 04/24/2018 Elsevier Patient Education  2020 [employer].

## 2018-12-28 ENCOUNTER — Other Ambulatory Visit: Payer: Self-pay

## 2018-12-28 ENCOUNTER — Encounter: Payer: Self-pay | Admitting: Obstetrics and Gynecology

## 2018-12-28 ENCOUNTER — Other Ambulatory Visit (HOSPITAL_COMMUNITY)
Admission: RE | Admit: 2018-12-28 | Discharge: 2018-12-28 | Disposition: A | Discharge status: Home / Self Care | Payer: Medicaid Other | Source: Ambulatory Visit | Attending: Obstetrics and Gynecology | Admitting: Obstetrics and Gynecology

## 2018-12-28 ENCOUNTER — Ambulatory Visit (INDEPENDENT_AMBULATORY_CARE_PROVIDER_SITE_OTHER): Payer: Medicaid Other | Admitting: Obstetrics and Gynecology

## 2018-12-28 VITALS — BP 108/71 | HR 66 | Ht 64.0 in | Wt 147.5 lb

## 2018-12-28 DIAGNOSIS — Z1231 Encounter for screening mammogram for malignant neoplasm of breast: Secondary | ICD-10-CM

## 2018-12-28 DIAGNOSIS — N9089 Other specified noninflammatory disorders of vulva and perineum: Secondary | ICD-10-CM

## 2018-12-28 DIAGNOSIS — Z131 Encounter for screening for diabetes mellitus: Secondary | ICD-10-CM

## 2018-12-28 DIAGNOSIS — Z113 Encounter for screening for infections with a predominantly sexual mode of transmission: Secondary | ICD-10-CM | POA: Diagnosis present

## 2018-12-28 DIAGNOSIS — Z01419 Encounter for gynecological examination (general) (routine) without abnormal findings: Secondary | ICD-10-CM

## 2018-12-28 DIAGNOSIS — Z13220 Encounter for screening for lipoid disorders: Secondary | ICD-10-CM

## 2018-12-28 DIAGNOSIS — Z8639 Personal history of other endocrine, nutritional and metabolic disease: Secondary | ICD-10-CM

## 2018-12-28 DIAGNOSIS — Z0000 Encounter for general adult medical examination without abnormal findings: Secondary | ICD-10-CM

## 2018-12-28 DIAGNOSIS — Z720 Tobacco use: Secondary | ICD-10-CM

## 2018-12-28 NOTE — Progress Notes (Signed)
GYNECOLOGY ANNUAL PHYSICAL EXAM PROGRESS NOTE  Subjective:    Theresa Ryan is a 48 y.o. [phone] female who presents for an annual exam.  She is transitioning from Dr. Greggory KeeneFrancesco, who is retired. The patient is sexually active (same partner). The patient wears seatbelts: yes. The patient participates in regular exercise: yes. Has the patient ever been transfused or tattooed?: yes (professional tattoos). The patient reports that there is not domestic violence in her life.   The patient has the following concerns today:  1.  The patient notes that she desires STI screening. Has a h/o trichomoniasis last year, has had on-again/off-again relationship with her partner over the past year, just "wants to be safe".  2. Patient notes a lesion near her clitoral region that she has had for many years. States that over the past year it has become occasionally more painful and thinks it may have gotten a little larger. 3. Had a h/o irregular menstrual cycles last year, received 1 dose of Depo Provera which caused her to bleed for the entire 3 months, but afterwards her cycles corrected and have been regular since then. No other issues.   Gynecologic History  Menarche age: 89 Patient's last menstrual period was 12/13/2018 (within days). Contraception: condoms and h/o tubal ligation History of STI's: Trichomonas infection in 2019, treated. H/o HSV.  Last Pap: . Results were: normal.  Notes h/o abnormal pap smear x 1, NILM with HR HPV+.  Also remote h/o in 1995 of abnormal pap. Last mammogram: 12/2017. Results were: normal   OB History  Gravida Para Term Preterm AB Living  6 4 2 2 2 3   SAB TAB Ectopic Multiple Live Births  0 2 0 0 3    # Outcome Date GA Lbr Len/2nd Weight Sex Delivery Anes PTL Lv  6 Preterm 2006        FD  5 Preterm 2000     CS-LTranv   LIV     Complications: Abruptio Placenta  4 TAB 1998          3 Term 381995    F Vag-Spont   LIV  2 TAB 1991          1 Term 361990    F  Vag-Spont   LIV    Past Medical History:  Diagnosis Date  . Abnormal Pap smear of cervix   . DDD (degenerative disc disease), cervical   . 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 BACK SURGERY    . CERVICAL BIOPSY  W/ LOOP ELECTRODE EXCISION    . CESAREAN SECTION    . CHOLECYSTECTOMY    . CRYOTHERAPY    . DILATION AND CURETTAGE OF UTERUS    . LAPAROSCOPY    . TUBAL LIGATION  2006    Family History  Problem Relation Age of Onset  . Diabetes Mother   . Colon cancer Maternal Grandmother   . Diabetes Daughter   . Prostate cancer Father   . Breast cancer Neg Hx   . Ovarian cancer Neg Hx     Social History   Socioeconomic History  . Marital status: Single    Spouse name: Not on file  . Number of children: Not on file  . Years of education: Not on file  . Highest education level: Not on file  Occupational History  . Not on file  Social Needs  . Financial resource strain: Not on file  . Food insecurity    Worry: Not on file    Inability: Not on file  . Transportation needs    Medical: Not on file    Non-medical: Not on file  Tobacco Use  . Smoking status: Current Every Day Smoker    Packs/day: 0.50    Types: Cigarettes  . Smokeless tobacco: Never Used  Substance and Sexual Activity  . Alcohol use: No  . Drug use: No  . Sexual activity: Yes    Birth control/protection: Surgical, Condom  Lifestyle  . Physical activity    Days per week: 5 days    Minutes per session: 20 min  . Stress: Not on file  Relationships  . Social Herbalist on phone: Not on file    Gets together: Not on file    Attends religious service: Not on file    Active member of club or organization: Not on file    Attends meetings of clubs or organizations: Not on file    Relationship status: Not on file  . Intimate  partner violence    Fear of current or ex partner: Not on file    Emotionally abused: Not on file    Physically abused: Not on file    Forced sexual activity: Not on file  Other Topics Concern  . Not on file  Social History Narrative  . Not on file    No current outpatient medications on file prior to visit.   No current facility-administered medications on file prior to visit.     Allergies  Allergen Reactions  . Vicoprofen [Hydrocodone-Ibuprofen] Other (See Comments)    Chest pains     Review of Systems Constitutional: negative for chills, fatigue, fevers and sweats Eyes: negative for irritation, redness and visual disturbance Ears, nose, mouth, throat, and face: negative for hearing loss, nasal congestion, snoring and tinnitus Respiratory: negative for asthma, cough, sputum Cardiovascular: negative for chest pain, dyspnea, exertional chest pressure/discomfort, irregular heart beat, palpitations and syncope Gastrointestinal: negative for abdominal pain, change in bowel habits, nausea and vomiting Genitourinary: negative for abnormal menstrual periods, sexual problems and vaginal discharge, dysuria and urinary incontinence. Positive for genital lesion (see HPI).  Integument/breast: negative for breast lump, breast tenderness and nipple discharge Hematologic/lymphatic: negative for bleeding and easy bruising Musculoskeletal:negative for back pain and muscle weakness Neurological: negative for dizziness, headaches, vertigo and weakness Endocrine: negative for diabetic symptoms including polydipsia, polyuria and skin dryness Allergic/Immunologic: negative for hay fever and urticaria        Objective:  Blood pressure 108/71, pulse 66, height 5\' 4"  (1.626 m), weight 147 lb 8 oz (66.9 kg), last menstrual period 12/13/2018. Body mass index is 25.32 kg/m.  General Appearance:    Alert, cooperative, no distress, appears stated age  Head:    Normocephalic, without obvious  abnormality, atraumatic  Eyes:    PERRL, conjunctiva/corneas clear, EOM's intact, both eyes  Ears:    Normal external ear canals, both ears  Nose:   Nares normal, septum midline, mucosa normal, no drainage or sinus tenderness  Throat:   Lips, mucosa, and tongue normal; teeth and gums normal  Neck:   Supple, symmetrical, trachea midline, no adenopathy; thyroid: no enlargement/tenderness/nodules; no carotid bruit or JVD  Back:     Symmetric, no curvature, ROM normal, no CVA tenderness  Lungs:     Clear to auscultation bilaterally,  respirations unlabored  Chest Wall:    No tenderness or deformity   Heart:    Regular rate and rhythm, S1 and S2 normal, no murmur, rub or gallop  Breast Exam:    No tenderness, masses, or nipple abnormality  Abdomen:     Soft, non-tender, bowel sounds active all four quadrants, no masses, no organomegaly.    Genitalia:    Pelvic:external genitalia normal. There is a 1 cm, midline, sub-clitoral/supra urethral cystic lesion present, mildly tender with manipulation. Vagina without lesions, discharge, or tenderness, rectovaginal septum  normal. Cervix normal in appearance, no cervical motion tenderness, no adnexal masses or tenderness.  Uterus normal size, shape, mobile, regular contours, nontender.  Rectal:    Normal external sphincter.  No hemorrhoids appreciated. Internal exam not done.   Extremities:   Extremities normal, atraumatic, no cyanosis or edema  Pulses:   2+ and symmetric all extremities  Skin:   Skin color, texture, turgor normal, no rashes or lesions  Lymph nodes:   Cervical, supraclavicular, and axillary nodes normal  Neurologic:   CNII-XII intact, normal strength, sensation and reflexes throughout   .  Labs:  No results found for: WBC, HGB, HCT, MCV, PLT  No results found for: CREATININE, BUN, NA, K, CL, CO2  No results found for: ALT, AST, GGT, ALKPHOS, BILITOT  No results found for: TSH   Assessment:   1. Encounter for well woman exam with  routine gynecological exam   2. Encounter for screening mammogram for malignant neoplasm of breast   3. Screening examination for STD (sexually transmitted disease)   4. Screening for diabetes mellitus   5. Screening, lipid   6. History of vitamin D deficiency   7. 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 Tobacco user    Plan:     Blood tests: CBC with diff, Comprehensive metabolic panel, HgbA1c, Lipoproteins, TSH, and Vitamin D  Breast self exam technique reviewed and patient encouraged to perform self-exam monthly. Contraception: condoms and tubal ligation.  However recently engaged in abstinence as her relationship ended.  Discussed healthy lifestyle modifications. Desires STI testing (vaginal panel only, declines HIV screen). GC/CT performed.  Pap smear performed today due to h/o abnormal pap smear, if normal can resume routine q 3 year screening.  Perineal cyst mildly bothersome to patient now. Discussed that due to location, could be drained in office or removed in OR if continues to be bothersome. Patient declines all options for now. Has had for several years and will continue to manage expectantly.  Mammogram ordered. Declines flu vaccine.  Encouraged smoking cessation. Handout given.  Follow up in 1 year for annual exam   Hildred Laser, MD [facility]

## 2018-12-29 LAB — COMPREHENSIVE METABOLIC PANEL
ALT: 10 IU/L (ref 0–32)
AST: 13 IU/L (ref 0–40)
Albumin/Globulin Ratio: 1.6 (ref 1.2–2.2)
Albumin: 4.1 g/dL (ref 3.8–4.8)
Alkaline Phosphatase: 66 IU/L (ref 39–117)
BUN/Creatinine Ratio: 10 (ref 9–23)
BUN: 8 mg/dL (ref 6–24)
Bilirubin Total: 0.3 mg/dL (ref 0.0–1.2)
CO2: 22 mmol/L (ref 20–29)
Calcium: 8.8 mg/dL (ref 8.7–10.2)
Chloride: 104 mmol/L (ref 96–106)
Creatinine, Ser: 0.84 mg/dL (ref 0.57–1.00)
GFR calc Af Amer: 96 mL/min/{1.73_m2} (ref >59)
GFR calc non Af Amer: 84 mL/min/{1.73_m2} (ref >59)
Globulin, Total: 2.5 g/dL (ref 1.5–4.5)
Glucose: 65 mg/dL (ref 65–99)
Potassium: 3.9 mmol/L (ref 3.5–5.2)
Sodium: 139 mmol/L (ref 134–144)
Total Protein: 6.6 g/dL (ref 6.0–8.5)

## 2018-12-29 LAB — CBC
Hematocrit: 39.7 % (ref 34.0–46.6)
Hemoglobin: 13.3 g/dL (ref 11.1–15.9)
MCH: 28.2 pg (ref 26.6–33.0)
MCHC: 33.5 g/dL (ref 31.5–35.7)
MCV: 84 fL (ref 79–97)
Platelets: 241 10*3/uL (ref 150–450)
RBC: 4.71 x10E6/uL (ref 3.77–5.28)
RDW: 11.8 % (ref 11.7–15.4)
WBC: 8.8 10*3/uL (ref 3.4–10.8)

## 2018-12-29 LAB — TSH: TSH: 1.33 u[IU]/mL (ref 0.450–4.500)

## 2018-12-29 LAB — VITAMIN D 25 HYDROXY (VIT D DEFICIENCY, FRACTURES): Vit D, 25-Hydroxy: 14.3 ng/mL — ABNORMAL LOW (ref 30.0–100.0)

## 2018-12-29 LAB — HEMOGLOBIN A1C
Est. average glucose Bld gHb Est-mCnc: 103 mg/dL
Hgb A1c MFr Bld: 5.2 % (ref 4.8–5.6)

## 2018-12-29 LAB — LIPID PANEL
Chol/HDL Ratio: 2.6 ratio (ref 0.0–4.4)
Cholesterol, Total: 138 mg/dL (ref 100–199)
HDL: 54 mg/dL (ref >39)
LDL Chol Calc (NIH): 70 mg/dL (ref 0–99)
Triglycerides: 70 mg/dL (ref 0–149)
VLDL Cholesterol Cal: 14 mg/dL (ref 5–40)

## 2018-12-30 ENCOUNTER — Other Ambulatory Visit (HOSPITAL_COMMUNITY)
Admission: RE | Admit: 2018-12-30 | Discharge: 2018-12-30 | Disposition: A | Discharge status: Home / Self Care | Payer: Medicaid Other | Source: Ambulatory Visit | Attending: Obstetrics and Gynecology | Admitting: Obstetrics and Gynecology

## 2018-12-30 DIAGNOSIS — Z01419 Encounter for gynecological examination (general) (routine) without abnormal findings: Secondary | ICD-10-CM | POA: Insufficient documentation

## 2018-12-30 NOTE — Addendum Note (Signed)
Addended by: Edwyna Shell on: 12/30/2018 09:14 AM   Modules accepted: Orders

## 2018-12-31 LAB — CERVICOVAGINAL ANCILLARY ONLY
Bacterial Vaginitis (gardnerella): POSITIVE — AB
Candida Glabrata: NEGATIVE
Candida Vaginitis: NEGATIVE
Chlamydia: NEGATIVE
Comment: NEGATIVE
Comment: NEGATIVE
Comment: NEGATIVE
Comment: NEGATIVE
Comment: NEGATIVE
Comment: NORMAL
Neisseria Gonorrhea: NEGATIVE
Trichomonas: NEGATIVE

## 2019-01-03 ENCOUNTER — Telehealth: Payer: Self-pay

## 2019-01-03 ENCOUNTER — Other Ambulatory Visit: Payer: Self-pay

## 2019-01-03 MED ORDER — METRONIDAZOLE 500 MG PO TABS: 500.0000 mg | ORAL_TABLET | Freq: Two times a day (BID) | ORAL | 0 refills | Status: DC

## 2019-01-03 MED ORDER — FLUCONAZOLE 150 MG PO TABS: 150.0000 mg | ORAL_TABLET | Freq: Once | ORAL | 1 refills | Status: AC

## 2019-01-03 MED ORDER — VITAMIN D (ERGOCALCIFEROL) 1.25 MG (50000 UNIT) PO CAPS: 50000.0000 [IU] | ORAL_CAPSULE | ORAL | 0 refills | Status: DC

## 2019-01-03 NOTE — Telephone Encounter (Signed)
Pt states Vitamin D resulted low in MyChart and BV. Pt asking if meds will be called in and also request pap results once they come back. Pt confirmed pharmacy on acct is correct. Please advise.

## 2019-01-03 NOTE — Telephone Encounter (Signed)
Pt is aware of test results and medication was sent to pharmacy as prescribed.

## 2019-01-05 NOTE — Telephone Encounter (Signed)
Please see result notes encounter.  

## 2019-01-06 LAB — CYTOLOGY - PAP
Comment: NEGATIVE
Diagnosis: REACTIVE
High risk HPV: NEGATIVE

## 2019-01-07 ENCOUNTER — Telehealth: Payer: Self-pay | Admitting: Obstetrics and Gynecology

## 2019-01-07 NOTE — Telephone Encounter (Signed)
Pt called to check status of pap results. Please advise

## 2019-01-19 NOTE — Telephone Encounter (Signed)
Pt is aware of test results.  

## 2019-04-04 ENCOUNTER — Other Ambulatory Visit: Payer: Medicaid Other

## 2019-04-07 ENCOUNTER — Other Ambulatory Visit: Payer: Medicaid Other

## 2019-04-12 ENCOUNTER — Other Ambulatory Visit: Payer: Medicaid Other

## 2019-04-12 NOTE — Addendum Note (Signed)
Addended by: Silvano Bilis on: 04/12/2019 08:57 AM   Modules accepted: Orders
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
## 2019-12-03 IMAGING — CR DG CHEST 2V
2 series · 2 of 2 positions shown · non-contrast
Comparison: 08/06/2017

CLINICAL DATA: Productive cough for 1 month

EXAM:
CHEST - 2 VIEW

[chest pa]
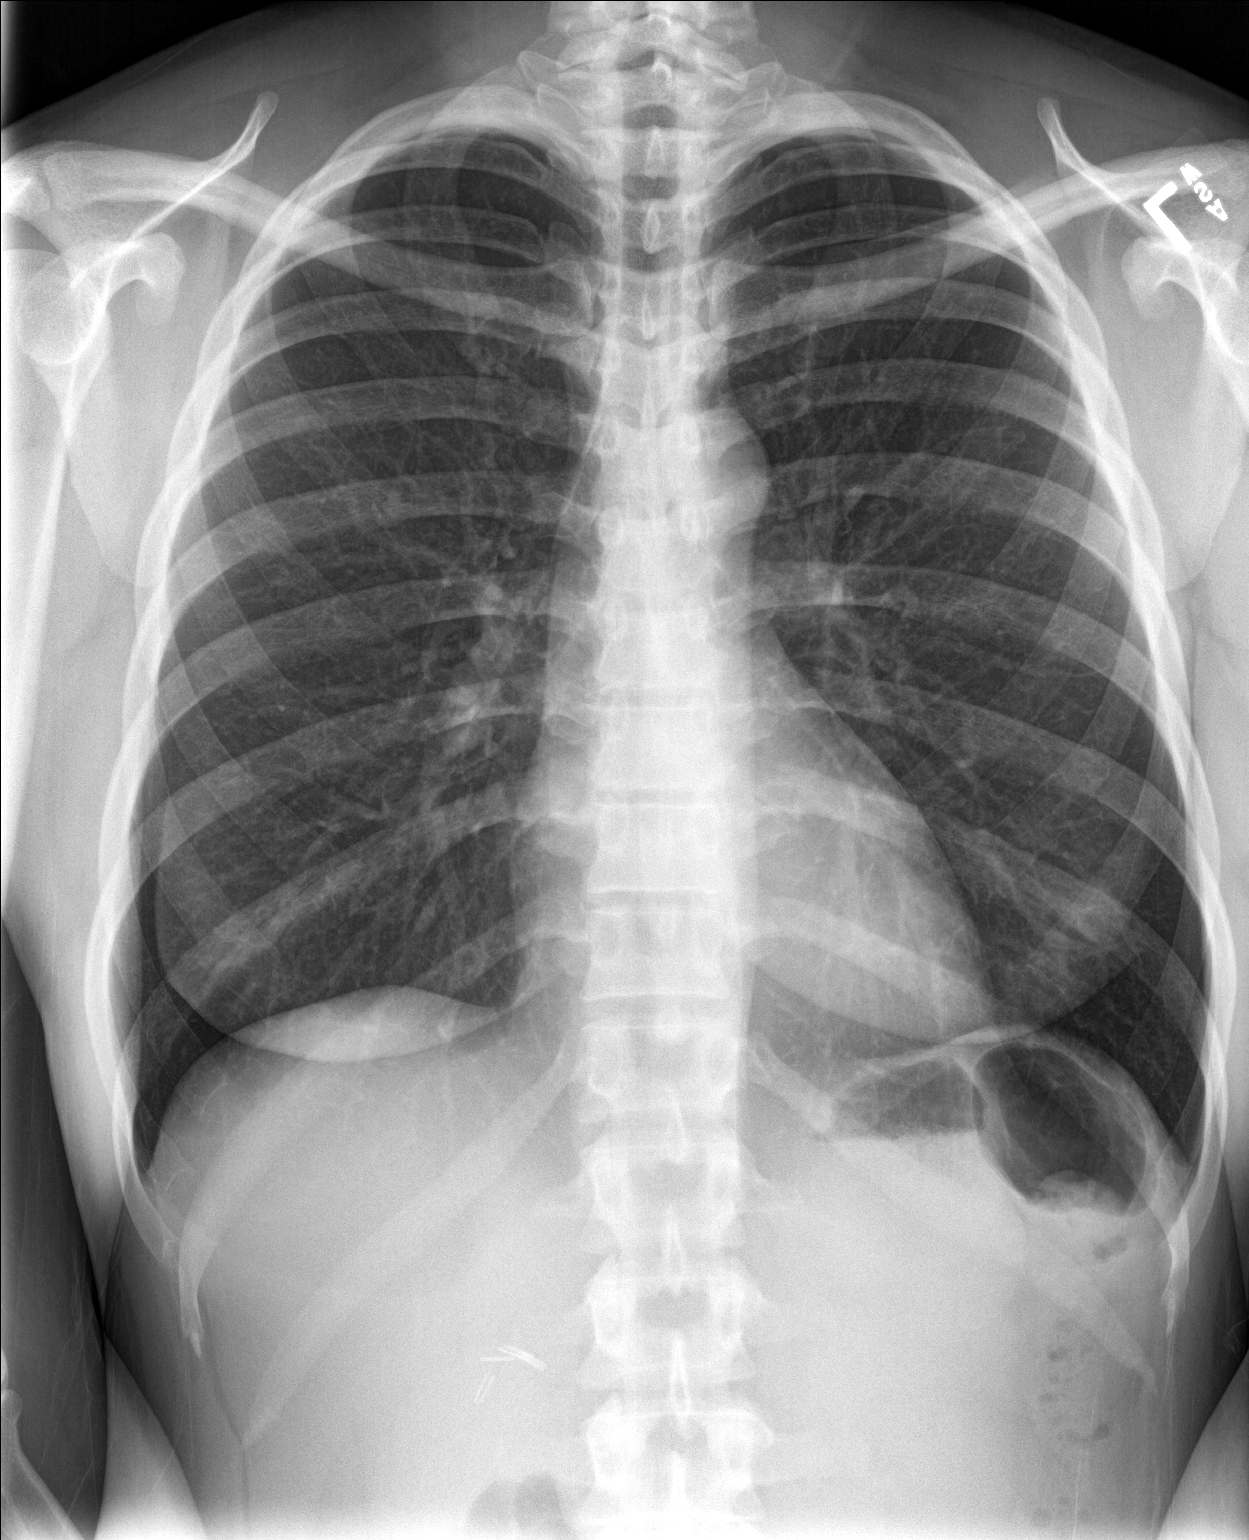

[chest lat]
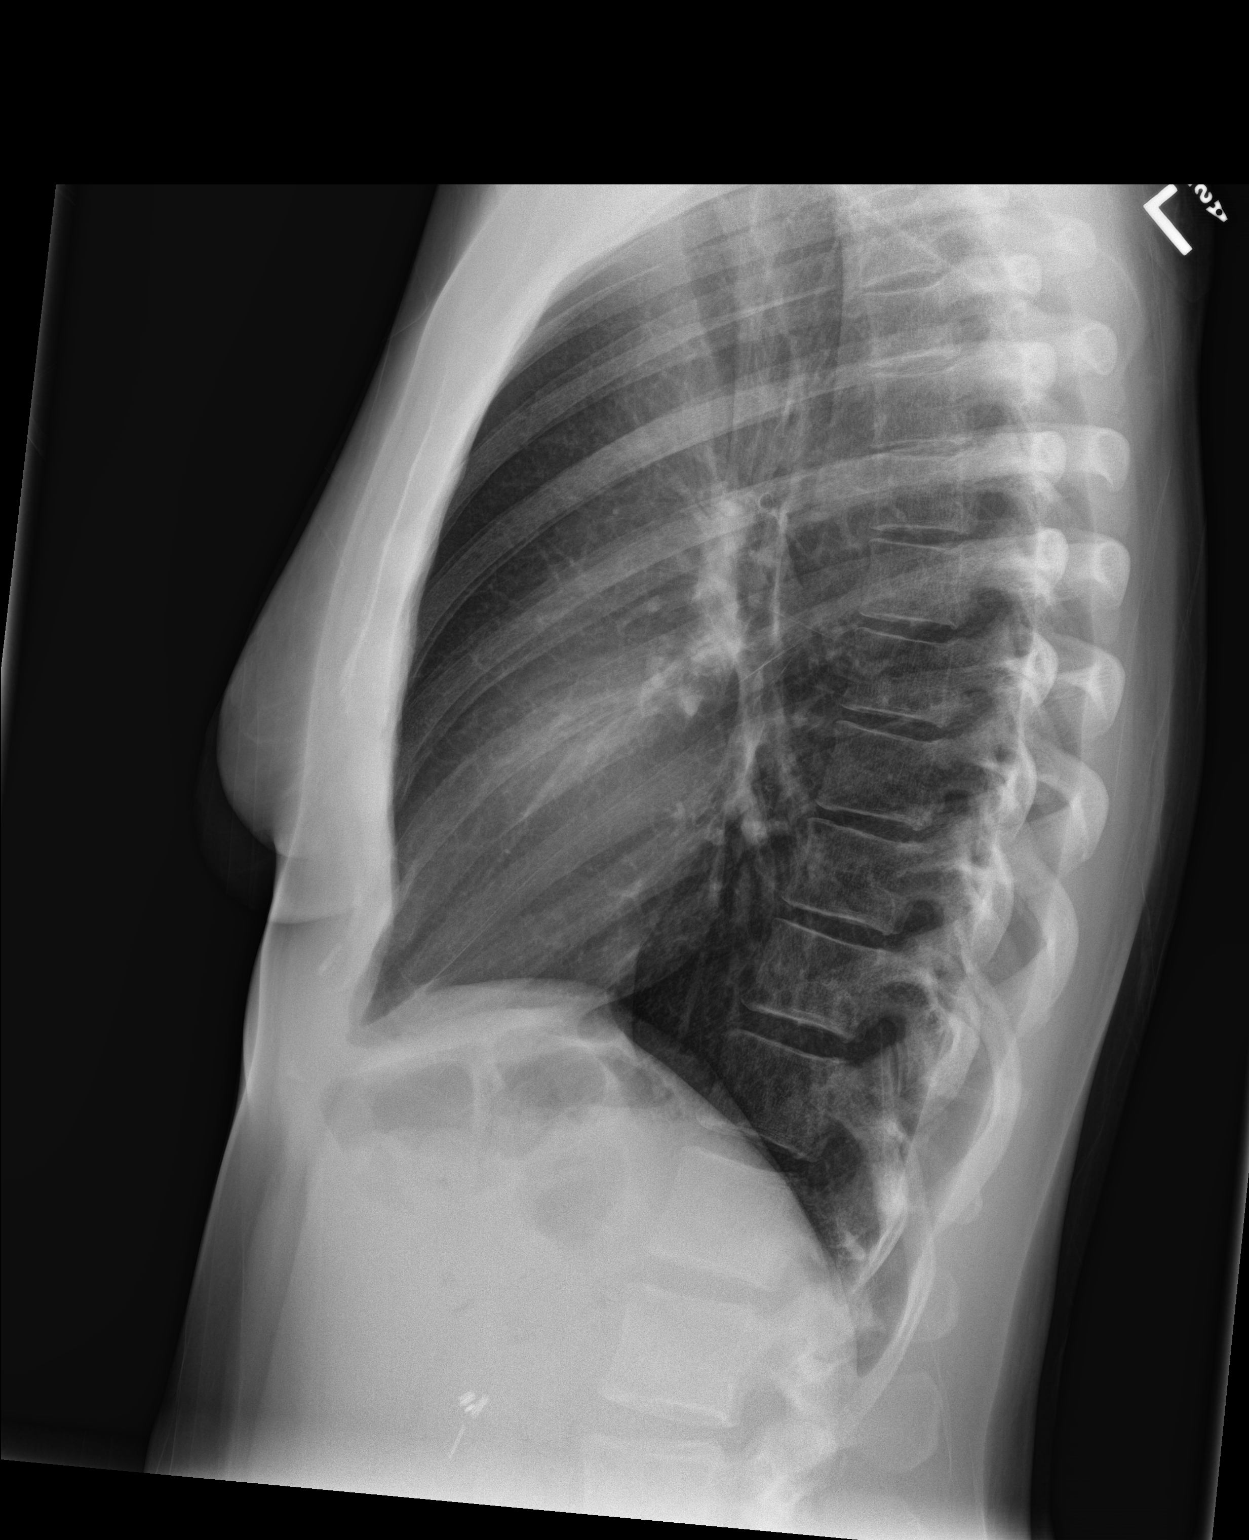

[2 of 2 positions shown; findings below may reference images not displayed]

FINDINGS: The heart size and mediastinal contours are within normal limits.
Both lungs are clear. The visualized skeletal structures are
unremarkable.
IMPRESSION: Normal chest.

## 2019-12-30 ENCOUNTER — Other Ambulatory Visit: Payer: Self-pay

## 2019-12-30 ENCOUNTER — Ambulatory Visit (INDEPENDENT_AMBULATORY_CARE_PROVIDER_SITE_OTHER): Payer: Medicaid Other | Admitting: Obstetrics and Gynecology

## 2019-12-30 ENCOUNTER — Other Ambulatory Visit (HOSPITAL_COMMUNITY)
Admission: RE | Admit: 2019-12-30 | Discharge: 2019-12-30 | Disposition: A | Discharge status: Home / Self Care | Payer: Medicaid Other | Source: Ambulatory Visit | Attending: Obstetrics and Gynecology | Admitting: Obstetrics and Gynecology

## 2019-12-30 ENCOUNTER — Encounter: Payer: Self-pay | Admitting: Obstetrics and Gynecology

## 2019-12-30 VITALS — BP 120/78 | HR 81 | Ht 64.0 in | Wt 152.8 lb

## 2019-12-30 DIAGNOSIS — R5383 Other fatigue: Secondary | ICD-10-CM

## 2019-12-30 DIAGNOSIS — Z1231 Encounter for screening mammogram for malignant neoplasm of breast: Secondary | ICD-10-CM

## 2019-12-30 DIAGNOSIS — Z113 Encounter for screening for infections with a predominantly sexual mode of transmission: Secondary | ICD-10-CM | POA: Insufficient documentation

## 2019-12-30 DIAGNOSIS — N951 Menopausal and female climacteric states: Secondary | ICD-10-CM

## 2019-12-30 DIAGNOSIS — R399 Unspecified symptoms and signs involving the genitourinary system: Secondary | ICD-10-CM | POA: Diagnosis not present

## 2019-12-30 DIAGNOSIS — Z8639 Personal history of other endocrine, nutritional and metabolic disease: Secondary | ICD-10-CM

## 2019-12-30 DIAGNOSIS — Z01419 Encounter for gynecological examination (general) (routine) without abnormal findings: Secondary | ICD-10-CM

## 2019-12-30 LAB — POCT URINALYSIS DIPSTICK
Bilirubin, UA: NEGATIVE
Blood, UA: NEGATIVE
Glucose, UA: NEGATIVE
Ketones, UA: NEGATIVE
Leukocytes, UA: NEGATIVE
Nitrite, UA: NEGATIVE
Protein, UA: NEGATIVE
Spec Grav, UA: 1.02 (ref 1.010–1.025)
Urobilinogen, UA: 0.2 E.U./dL
pH, UA: 6 (ref 5.0–8.0)

## 2019-12-30 NOTE — Patient Instructions (Addendum)
Preventive Care 40-47 Years Old, Female Preventive care refers to visits with your health care provider and lifestyle choices that can promote health and wellness. This includes:  A yearly physical exam. This may also be called an annual well check.  Regular dental visits and eye exams.  Immunizations.  Screening for certain conditions.  Healthy lifestyle choices, such as eating a healthy diet, getting regular exercise, not using drugs or products that contain nicotine and tobacco, and limiting alcohol use. What can I expect for my preventive care visit? Physical exam Your health care provider will check your:  Height and weight. This may be used to calculate body mass index (BMI), which tells if you are at a healthy weight.  Heart rate and blood pressure.  Skin for abnormal spots. Counseling Your health care provider may ask you questions about your:  Alcohol, tobacco, and drug use.  Emotional well-being.  Home and relationship well-being.  Sexual activity.  Eating habits.  Work and work environment.  Method of birth control.  Menstrual cycle.  Pregnancy history. What immunizations do I need?  Influenza (flu) vaccine  This is recommended every year. Tetanus, diphtheria, and pertussis (Tdap) vaccine  You may need a Td booster every 10 years. Varicella (chickenpox) vaccine  You may need this if you have not been vaccinated. Zoster (shingles) vaccine  You may need this after age 60. Measles, mumps, and rubella (MMR) vaccine  You may need at least one dose of MMR if you were born in 1957 or later. You may also need a second dose. Pneumococcal conjugate (PCV13) vaccine  You may need this if you have certain conditions and were not previously vaccinated. Pneumococcal polysaccharide (PPSV23) vaccine  You may need one or two doses if you smoke cigarettes or if you have certain conditions. Meningococcal conjugate (MenACWY) vaccine  You may need this if you  have certain conditions. Hepatitis A vaccine  You may need this if you have certain conditions or if you travel or work in places where you may be exposed to hepatitis A. Hepatitis B vaccine  You may need this if you have certain conditions or if you travel or work in places where you may be exposed to hepatitis B. Haemophilus influenzae type b (Hib) vaccine  You may need this if you have certain conditions. Human papillomavirus (HPV) vaccine  If recommended by your health care provider, you may need three doses over 6 months. You may receive vaccines as individual doses or as more than one vaccine together in one shot (combination vaccines). Talk with your health care provider about the risks and benefits of combination vaccines. What tests do I need? Blood tests  Lipid and cholesterol levels. These may be checked every 5 years, or more frequently if you are over 50 years old.  Hepatitis C test.  Hepatitis B test. Screening  Lung cancer screening. You may have this screening every year starting at age 55 if you have a 30-pack-year history of smoking and currently smoke or have quit within the past 15 years.  Colorectal cancer screening. All adults should have this screening starting at age 50 and continuing until age 75. Your health care provider may recommend screening at age 45 if you are at increased risk. You will have tests every 1-10 years, depending on your results and the type of screening test.  Diabetes screening. This is done by checking your blood sugar (glucose) after you have not eaten for a while (fasting). You may have this   done every 1-3 years.  Mammogram. This may be done every 1-2 years. Talk with your health care provider about when you should start having regular mammograms. This may depend on whether you have a family history of breast cancer.  BRCA-related cancer screening. This may be done if you have a family history of breast, ovarian, tubal, or peritoneal  cancers.  Pelvic exam and Pap test. This may be done every 3 years starting at age 60. Starting at age 7, this may be done every 5 years if you have a Pap test in combination with an HPV test. Other tests  Sexually transmitted disease (STD) testing.  Bone density scan. This is done to screen for osteoporosis. You may have this scan if you are at high risk for osteoporosis. Follow these instructions at home: Eating and drinking  Eat a diet that includes fresh fruits and vegetables, whole grains, lean protein, and low-fat dairy.  Take vitamin and mineral supplements as recommended by your health care provider.  Do not drink alcohol if: ? Your health care provider tells you not to drink. ? You are pregnant, may be pregnant, or are planning to become pregnant.  If you drink alcohol: ? Limit how much you have to 0-1 drink a day. ? Be aware of how much alcohol is in your drink. In the [place] one drink equals one 12 oz bottle of beer (355 mL), one 5 oz glass of wine (148 mL), or one 1 oz glass of hard liquor (44 mL). Lifestyle  Take daily care of your teeth and gums.  Stay active. Exercise for at least 30 minutes on 5 or more days each week.  Do not use any products that contain nicotine or tobacco, such as cigarettes, e-cigarettes, and chewing tobacco. If you need help quitting, ask your health care provider.  If you are sexually active, practice safe sex. Use a condom or other form of birth control (contraception) in order to prevent pregnancy and STIs (sexually transmitted infections).  If told by your health care provider, take low-dose aspirin daily starting at age 48. What's next?  Visit your health care provider once a year for a well check visit.  Ask your health care provider how often you should have your eyes and teeth checked.  Stay up to date on all vaccines. This information is not intended to replace advice given to you by your health care provider. Make sure you  discuss any questions you have with your health care provider. Document Revised: 10/15/2017 Document Reviewed: 10/15/2017 Elsevier Patient Education  2020 [place] Breast self-awareness is knowing how your breasts look and feel. Doing breast self-awareness is important. It allows you to catch a breast problem early while it is still small and can be treated. All women should do breast self-awareness, including women who have had breast implants. Tell your doctor if you notice a change in your breasts. What you need:  A mirror.  A well-lit room. How to do a breast self-exam A breast self-exam is one way to learn what is normal for your breasts and to check for changes. To do a breast self-exam: Look for changes  1. Take off all the clothes above your waist. 2. Stand in front of a mirror in a room with good lighting. 3. Put your hands on your hips. 4. Push your hands down. 5. Look at your breasts and nipples in the mirror to see if one breast or nipple looks different from the  other. Check to see if: ? The shape of one breast is different. ? The size of one breast is different. ? There are wrinkles, dips, and bumps in one breast and not the other. 6. Look at each breast for changes in the skin, such as: ? Redness. ? Scaly areas. 7. Look for changes in your nipples, such as: ? Liquid around the nipples. ? Bleeding. ? Dimpling. ? Redness. ? A change in where the nipples are. Feel for changes  1. Lie on your back on the floor. 2. Feel each breast. To do this, follow these steps: ? Pick a breast to feel. ? Put the arm closest to that breast above your head. ? Use your other arm to feel the nipple area of your breast. Feel the area with the pads of your three middle fingers by making small circles with your fingers. For the first circle, press lightly. For the second circle, press harder. For the third circle, press even harder. ? Keep making circles with  your fingers at the different pressures as you move down your breast. Stop when you feel your ribs. ? Move your fingers a little toward the center of your body. ? Start making circles with your fingers again, this time going up until you reach your collarbone. ? Keep making up-and-down circles until you reach your armpit. Remember to keep using the three pressures. ? Feel the other breast in the same way. 3. Sit or stand in the tub or shower. 4. With soapy water on your skin, feel each breast the same way you did in step 2 when you were lying on the floor. Write down what you find Writing down what you find can help you remember what to tell your doctor. Write down:  What is normal for each breast.  Any changes you find in each breast, including: ? The kind of changes you find. ? Whether you have pain. ? Size and location of any lumps.  When you last had your menstrual period. General tips  Check your breasts every month.  If you are breastfeeding, the best time to check your breasts is after you feed your baby or after you use a breast pump.  If you get menstrual periods, the best time to check your breasts is 5-7 days after your menstrual period is over.  With time, you will become comfortable with the self-exam, and you will begin to know if there are changes in your breasts. Contact a doctor if you:  See a change in the shape or size of your breasts or nipples.  See a change in the skin of your breast or nipples, such as red or scaly skin.  Have fluid coming from your nipples that is not normal.  Find a lump or thick area that was not there before.  Have pain in your breasts.  Have any concerns about your breast health. Summary  Breast self-awareness includes looking for changes in your breasts, as well as feeling for changes within your breasts.  Breast self-awareness should be done in front of a mirror in a well-lit room.  You should check your breasts every month.  If you get menstrual periods, the best time to check your breasts is 5-7 days after your menstrual period is over.  Let your doctor know of any changes you see in your breasts, including changes in size, changes on the skin, pain or tenderness, or fluid from your nipples that is not normal. This information is not  intended to replace advice given to you by your health care provider. Make sure you discuss any questions you have with your health care provider. Document Revised: 09/22/2017 Document Reviewed: 09/22/2017 Elsevier Patient Education  2020 [place] Breast self-awareness means being familiar with how your breasts look and feel. It involves checking your breasts regularly and reporting any changes to your health care provider. Practicing breast self-awareness is important. Sometimes changes may not be harmful (are benign), but sometimes a change in your breasts can be a sign of a serious medical problem. It is important to learn how to do this procedure correctly so that you can catch problems early, when treatment is more likely to be successful. All women should practice breast self-awareness, including women who have had breast implants. What you need:  A mirror.  A well-lit room. How to do a breast self-exam A breast self-exam is one way to learn what is normal for your breasts and whether your breasts are changing. To do a breast self-exam: Look for changes  1. Remove all the clothing above your waist. 2. Stand in front of a mirror in a room with good lighting. 3. Put your hands on your hips. 4. Push your hands firmly downward. 5. Compare your breasts in the mirror. Look for differences between them (asymmetry), such as: ? Differences in shape. ? Differences in size. ? Puckers, dips, and bumps in one breast and not the other. 6. Look at each breast for changes in the skin, such as: ? Redness. ? Scaly areas. 7. Look for changes in your  nipples, such as: ? Discharge. ? Bleeding. ? Dimpling. ? Redness. ? A change in position. Feel for changes Carefully feel your breasts for lumps and changes. It is best to do this while lying on your back on the floor, and again while sitting or standing in the tub or shower with soapy water on your skin. Feel each breast in the following way: 1. Place the arm on the side of the breast you are examining above your head. 2. Feel your breast with the other hand. 3. Start in the nipple area and make -inch (2 cm) overlapping circles to feel your breast. Use the pads of your three middle fingers to do this. Apply light pressure, then medium pressure, then firm pressure. The light pressure will allow you to feel the tissue closest to the skin. The medium pressure will allow you to feel the tissue that is a little deeper. The firm pressure will allow you to feel the tissue close to the ribs. 4. Continue the overlapping circles, moving downward over the breast until you feel your ribs below your breast. 5. Move one finger-width toward the center of the body. Continue to use the -inch (2 cm) overlapping circles to feel your breast as you move slowly up toward your collarbone. 6. Continue the up-and-down exam using all three pressures until you reach your armpit.  Write down what you find Writing down what you find can help you remember what to discuss with your health care provider. Write down:  What is normal for each breast.  Any changes that you find in each breast, including: ? The kind of changes you find. ? Any pain or tenderness. ? Size and location of any lumps.  Where you are in your menstrual cycle, if you are still menstruating. General tips and recommendations  Examine your breasts every month.  If you are breastfeeding, the best time  to examine your breasts is after a feeding or after using a breast pump.  If you menstruate, the best time to examine your breasts is 5-7 days  after your period. Breasts are generally lumpier during menstrual periods, and it may be more difficult to notice changes.  With time and practice, you will become more familiar with the variations in your breasts and more comfortable with the exam. Contact a health care provider if you:  See a change in the shape or size of your breasts or nipples.  See a change in the skin of your breast or nipples, such as a reddened or scaly area.  Have unusual discharge from your nipples.  Find a lump or thick area that was not there before.  Have pain in your breasts.  Have any concerns related to your breast health. Summary  Breast self-awareness includes looking for physical changes in your breasts, as well as feeling for any changes within your breasts.  Breast self-awareness should be performed in front of a mirror in a well-lit room.  You should examine your breasts every month. If you menstruate, the best time to examine your breasts is 5-7 days after your menstrual period.  Let your health care provider know of any changes you notice in your breasts, including changes in size, changes on the skin, pain or tenderness, or unusual fluid from your nipples. This information is not intended to replace advice given to you by your health care provider. Make sure you discuss any questions you have with your health care provider. Document Revised: 09/22/2017 Document Reviewed: 09/22/2017 Elsevier Patient Education  2020 [employer].   Perimenopause  Perimenopause is the normal time of life before and after menstrual periods stop completely (menopause). Perimenopause can begin 2-8 years before menopause, and it usually lasts for 1 year after menopause. During perimenopause, the ovaries may or may not produce an egg. What are the causes? This condition is caused by a natural change in hormone levels that happens as you get older. What increases the risk? This condition is more likely to start  at an earlier age if you have certain medical conditions or treatments, including:  A tumor of the pituitary gland in the brain.  A disease that affects the ovaries and hormone production.  Radiation treatment for cancer.  Certain cancer treatments, such as chemotherapy or hormone (anti-estrogen) therapy.  Heavy smoking and excessive alcohol use.  Family history of early menopause. What are the signs or symptoms? Perimenopausal changes affect each woman differently. Symptoms of this condition may include:  Hot flashes.  Night sweats.  Irregular menstrual periods.  Decreased sex drive.  Vaginal dryness.  Headaches.  Mood swings.  Depression.  Memory problems or trouble concentrating.  Irritability.  Tiredness.  Weight gain.  Anxiety.  Trouble getting pregnant. How is this diagnosed? This condition is diagnosed based on your medical history, a physical exam, your age, your menstrual history, and your symptoms. Hormone tests may also be done. How is this treated? In some cases, no treatment is needed. You and your health care provider should make a decision together about whether treatment is necessary. Treatment will be based on your individual condition and preferences. Various treatments are available, such as:  Menopausal hormone therapy (MHT).  Medicines to treat specific symptoms.  Acupuncture.  Vitamin or herbal supplements. Before starting treatment, make sure to let your health care provider know if you have a personal or family history of:  Heart disease.  Breast cancer.  Blood clots.  Diabetes.  Osteoporosis. Follow these instructions at home: Lifestyle  Do not use any products that contain nicotine or tobacco, such as cigarettes and e-cigarettes. If you need help quitting, ask your health care provider.  Eat a balanced diet that includes fresh fruits and vegetables, whole grains, soybeans, eggs, lean meat, and low-fat dairy.  Get at  least 30 minutes of physical activity on 5 or more days each week.  Avoid alcoholic and caffeinated beverages, as well as spicy foods. This may help prevent hot flashes.  Get 7-8 hours of sleep each night.  Dress in layers that can be removed to help you manage hot flashes.  Find ways to manage stress, such as deep breathing, meditation, or journaling. General instructions  Keep track of your menstrual periods, including: ? When they occur. ? How heavy they are and how long they last. ? How much time passes between periods.  Keep track of your symptoms, noting when they start, how often you have them, and how long they last.  Take over-the-counter and prescription medicines only as told by your health care provider.  Take vitamin supplements only as told by your health care provider. These may include calcium, vitamin E, and vitamin D.  Use vaginal lubricants or moisturizers to help with vaginal dryness and improve comfort during sex.  Talk with your health care provider before starting any herbal supplements.  Keep all follow-up visits as told by your health care provider. This is important. This includes any group therapy or counseling. Contact a health care provider if:  You have heavy vaginal bleeding or pass blood clots.  Your period lasts more than 2 days longer than normal.  Your periods are recurring sooner than 21 days.  You bleed after having sex. Get help right away if:  You have chest pain, trouble breathing, or trouble talking.  You have severe depression.  You have pain when you urinate.  You have severe headaches.  You have vision problems. Summary  Perimenopause is the time when a woman's body begins to move into menopause. This may happen naturally or as a result of other health problems or medical treatments.  Perimenopause can begin 2-8 years before menopause, and it usually lasts for 1 year after menopause.  Perimenopausal symptoms can be  managed through medicines, lifestyle changes, and complementary therapies such as acupuncture. This information is not intended to replace advice given to you by your health care provider. Make sure you discuss any questions you have with your health care provider. Document Revised: 01/16/2017 Document Reviewed: 03/11/2016 Elsevier Patient Education  2020 [employer].

## 2019-12-30 NOTE — Progress Notes (Signed)
Pt present for routine prenatal care. Pt stated having uti symptoms urine with odor and fatigue.  Pt requesting to have Vit D levels checked and other blood work done today.

## 2019-12-30 NOTE — Progress Notes (Signed)
GYNECOLOGY ANNUAL PHYSICAL EXAM PROGRESS NOTE  Subjective:    Theresa Ryan is a 49 y.o. [phone] female who presents for an annual exam.  The patient is sexually active (same partner). The patient wears seatbelts: yes. The patient participates in regular exercise: yes. Has the patient ever been transfused or tattooed?: yes (professional tattoos). The patient reports that there is not domestic violence in her life.   The patient has the following concerns today:  1.  Urinary odor - she reports that over the past several weeks to months she has noted an odor in her urine. Also occasionally noting some UTI symptoms (frequency).  Drinks plenty of water and cranberry juice but still noticing the odor.  2. Is noting more fatigue lately. Has a history of Vitamin D deficiency in the past.  3. Notes irregular menstrual cycles sometimes. Notes occasionally will have 2 cycles in a month, and then resumes to regular cycles. States this happened several years ago with a negative workup but then spontaneously resolved. Was told it was likely due to perimenopausal status. Also has a stressful job and knows this can sometimes contribute to  She is however more concerned with her mood changes just prior to her cycle. Notes family is noting her mood swings.     Gynecologic History  Menarche age: 36 Patient's last menstrual period was 12/16/2019. Contraception: condoms and h/o tubal ligation History of STI's: Trichomonas infection in 2019, treated. H/o HSV.  Last Pap: 12/30/2018. Results were: normal.  Notes h/o abnormal pap smear x 1, NILM with HR HPV+ in 2018.  Also remote h/o in 1995 of abnormal pap. H/o LEEP.  Last mammogram: 12/2017. Results were: normal   OB History  Gravida Para Term Preterm AB Living  6 4 2 2 2 3   SAB TAB Ectopic Multiple Live Births  0 2 0 0 3    # Outcome Date GA Lbr Len/2nd Weight Sex Delivery Anes PTL Lv  6 Preterm 2006        FD  5 Preterm 2000     CS-LTranv   LIV       Complications: Abruptio Placenta  4 TAB 1998          3 Term 67    F Vag-Spont   LIV  2 TAB 1991          1 Term 2    F Vag-Spont   LIV    Past Medical History:  Diagnosis Date  . Abnormal Pap smear of cervix   . DDD (degenerative disc disease), cervical   . 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 BACK SURGERY    . CERVICAL BIOPSY  W/ LOOP ELECTRODE EXCISION    . CESAREAN SECTION    . CHOLECYSTECTOMY    . CRYOTHERAPY    . DILATION AND CURETTAGE OF UTERUS    . LAPAROSCOPY    . TUBAL LIGATION  2006    Family History  Problem Relation Age of Onset  . Diabetes Mother   . Colon cancer Maternal Grandmother   . Diabetes Daughter   . Prostate cancer Father   . Breast cancer Neg Hx   . Ovarian cancer Neg Hx     Social History   Socioeconomic History  . Marital status: Single    Spouse name: Not  on file  . Number of children: Not on file  . Years of education: Not on file  . Highest education level: Not on file  Occupational History  . Not on file  Tobacco Use  . Smoking status: Current Every Day Smoker    Packs/day: 0.50    Types: Cigarettes  . Smokeless tobacco: Never Used  Vaping Use  . Vaping Use: Never used  Substance and Sexual Activity  . Alcohol use: No  . Drug use: No  . Sexual activity: Yes    Birth control/protection: Surgical, Condom  Other Topics Concern  . Not on file  Social History Narrative  . Not on file   Social Determinants of Health   Financial Resource Strain:   . Difficulty of Paying Living Expenses: Not on file  Food Insecurity:   . Worried About Programme researcher, broadcasting/film/video in the Last Year: Not on file  . Ran Out of Food in the Last Year: Not on file  Transportation Needs:   . Lack of Transportation (Medical): Not on file  . Lack of Transportation (Non-Medical): Not on  file  Physical Activity:   . Days of Exercise per Week: Not on file  . Minutes of Exercise per Session: Not on file  Stress:   . Feeling of Stress : Not on file  Social Connections:   . Frequency of Communication with Friends and Family: Not on file  . Frequency of Social Gatherings with Friends and Family: Not on file  . Attends Religious Services: Not on file  . Active Member of Clubs or Organizations: Not on file  . Attends Banker Meetings: Not on file  . Marital Status: Not on file  Intimate Partner Violence:   . Fear of Current or Ex-Partner: Not on file  . Emotionally Abused: Not on file  . Physically Abused: Not on file  . Sexually Abused: Not on file    No current outpatient medications on file prior to visit.   No current facility-administered medications on file prior to visit.    Allergies  Allergen Reactions  . Vicoprofen [Hydrocodone-Ibuprofen] Other (See Comments)    Chest pains    Review of Systems Constitutional: negative for chills, fatigue, fevers and sweats Eyes: negative for irritation, redness and visual disturbance Ears, nose, mouth, throat, and face: negative for hearing loss, nasal congestion, snoring and tinnitus Respiratory: negative for asthma, cough, sputum Cardiovascular: negative for chest pain, dyspnea, exertional chest pressure/discomfort, irregular heart beat, palpitations and syncope Gastrointestinal: negative for abdominal pain, change in bowel habits, nausea and vomiting Genitourinary: positive for irregular menstrual periods, UTI symptoms (see HPI). Negative for sexual problems and vaginal discharge, dysuria and urinary incontinence. Integument/breast: negative for breast lump, breast tenderness and nipple discharge Hematologic/lymphatic: negative for bleeding and easy bruising Musculoskeletal:negative for back pain and muscle weakness Neurological: negative for dizziness, headaches, vertigo and weakness Endocrine: negative  for diabetic symptoms including polydipsia, polyuria and skin dryness Allergic/Immunologic: negative for hay fever and urticaria       Objective:  Blood pressure 120/78, pulse 81, height 5\' 4"  (1.626 m), weight 152 lb 12.8 oz (69.3 kg), last menstrual period 12/16/2019. Body mass index is 26.23 kg/m.  General Appearance:    Alert, cooperative, no distress, appears stated age  Head:    Normocephalic, without obvious abnormality, atraumatic  Eyes:    PERRL, conjunctiva/corneas clear, EOM's intact, both eyes  Ears:    Normal external ear canals, both ears  Nose:   Nares normal,  septum midline, mucosa normal, no drainage or sinus tenderness  Throat:   Lips, mucosa, and tongue normal; teeth and gums normal  Neck:   Supple, symmetrical, trachea midline, no adenopathy; thyroid: no enlargement/tenderness/nodules; no carotid bruit or JVD  Back:     Symmetric, no curvature, ROM normal, no CVA tenderness  Lungs:     Clear to auscultation bilaterally, respirations unlabored  Chest Wall:    No tenderness or deformity   Heart:    Regular rate and rhythm, S1 and S2 normal, no murmur, rub or gallop  Breast Exam:    No tenderness, masses, or nipple abnormality  Abdomen:     Soft, non-tender, bowel sounds active all four quadrants, no masses, no organomegaly.    Genitalia:    Pelvic:external genitalia normal. Vagina without lesions, discharge, or tenderness, rectovaginal septum  normal. Cervix normal in appearance, no cervical motion tenderness, no adnexal masses or tenderness.  Uterus normal size, shape, mobile, regular contours, nontender.  Rectal:    Normal external sphincter.  No hemorrhoids appreciated. Internal exam not done.   Extremities:   Extremities normal, atraumatic, no cyanosis or edema  Pulses:   2+ and symmetric all extremities  Skin:   Skin color, texture, turgor normal, no rashes or lesions  Lymph nodes:   Cervical, supraclavicular, and axillary nodes normal  Neurologic:   CNII-XII intact,  normal strength, sensation and reflexes throughout   .  Labs:  Lab Results  Component Value Date   WBC 8.8 12/28/2018   HGB 13.3 12/28/2018   HCT 39.7 12/28/2018   MCV 84 12/28/2018   PLT 241 12/28/2018    Lab Results  Component Value Date   CREATININE 0.84 12/28/2018   BUN 8 12/28/2018   NA 139 12/28/2018   K 3.9 12/28/2018   CL 104 12/28/2018   CO2 22 12/28/2018    Lab Results  Component Value Date   ALT 10 12/28/2018   AST 13 12/28/2018   ALKPHOS 66 12/28/2018   BILITOT 0.3 12/28/2018    Lab Results  Component Value Date   TSH 1.330 12/28/2018     Results for orders placed or performed in visit on 12/30/19  POCT urinalysis dipstick  Result Value Ref Range   Color, UA auburn    Clarity, UA clear    Glucose, UA Negative Negative   Bilirubin, UA neg    Ketones, UA neg    Spec Grav, UA 1.020 1.010 - 1.025   Blood, UA neg    pH, UA 6.0 5.0 - 8.0   Protein, UA Negative Negative   Urobilinogen, UA 0.2 0.2 or 1.0 E.U./dL   Nitrite, UA neg    Leukocytes, UA Negative Negative   Appearance auburn;clear    Odor      Assessment:   1. Encounter for well woman exam with routine gynecological exam   2. Breast cancer screening by mammogram   3. UTI symptoms   4. Fatigue, unspecified type   5. History of vitamin D deficiency   6. Perimenopausal   7. Screen for STD (sexually transmitted disease)    Plan:    - Blood tests: CBC with diff, Comprehensive metabolic panel, HgbA1c, Lipoproteins, and Vitamin D/B12.  Has normal TSH last year.  - Breast self exam technique reviewed and patient encouraged to perform self-exam monthly. - Contraception: condoms and tubal ligation.   - Discussed healthy lifestyle modifications. - Desires STI testing (cervicitis screen, declines HIV). Will also screen for Hepatitis C today as patient  has never had screening, per CDC recommendations.  - Pap smear normal, continue routine screening q 3 years.  - Mammogram ordered. -  Declines flu vaccine.  - Declines COVID vaccination.  - Encouraged smoking cessation. Down to 1/2 pk every 2 days. Only takes a few puffs of each cigarette.   - Urinary odor with UTI symptoms, UA today normal.  Discussed current diet, patient notes eating asparagus at least once or twice per week. Advised that she can try elimination diet to see if symptoms abate.  - Fatigue present, patient with h/o Vitamin D deficiency. Will recheck levels, also check Vitamin B12.  - Perimenopausal, with occasional irregular cycles, also with mood changes associated with cycles. Discussed options of hormonal supplementation to regulate menses and mood, or could consider an SSRI to use prior to onset of menstrual cycle. Patient desires to think over her options.  - Follow up in 1 year for annual exam.  Follow up sooner if needed.    Hildred Laser, MD [facility]

## 2019-12-31 LAB — COMPREHENSIVE METABOLIC PANEL
ALT: 12 IU/L (ref 0–32)
AST: 13 IU/L (ref 0–40)
Albumin/Globulin Ratio: 1.6 (ref 1.2–2.2)
Albumin: 4.2 g/dL (ref 3.8–4.8)
Alkaline Phosphatase: 64 IU/L (ref 44–121)
BUN/Creatinine Ratio: 7 — ABNORMAL LOW (ref 9–23)
BUN: 6 mg/dL (ref 6–24)
Bilirubin Total: 0.3 mg/dL (ref 0.0–1.2)
CO2: 20 mmol/L (ref 20–29)
Calcium: 8.8 mg/dL (ref 8.7–10.2)
Chloride: 102 mmol/L (ref 96–106)
Creatinine, Ser: 0.82 mg/dL (ref 0.57–1.00)
GFR calc Af Amer: 99 mL/min/{1.73_m2} (ref >59)
GFR calc non Af Amer: 85 mL/min/{1.73_m2} (ref >59)
Globulin, Total: 2.7 g/dL (ref 1.5–4.5)
Glucose: 89 mg/dL (ref 65–99)
Potassium: 3.8 mmol/L (ref 3.5–5.2)
Sodium: 139 mmol/L (ref 134–144)
Total Protein: 6.9 g/dL (ref 6.0–8.5)

## 2019-12-31 LAB — HEMOGLOBIN A1C
Est. average glucose Bld gHb Est-mCnc: 105 mg/dL
Hgb A1c MFr Bld: 5.3 % (ref 4.8–5.6)

## 2019-12-31 LAB — CBC
Hematocrit: 39.4 % (ref 34.0–46.6)
Hemoglobin: 13.1 g/dL (ref 11.1–15.9)
MCH: 28.4 pg (ref 26.6–33.0)
MCHC: 33.2 g/dL (ref 31.5–35.7)
MCV: 86 fL (ref 79–97)
Platelets: 247 10*3/uL (ref 150–450)
RBC: 4.61 x10E6/uL (ref 3.77–5.28)
RDW: 12.1 % (ref 11.7–15.4)
WBC: 9.7 10*3/uL (ref 3.4–10.8)

## 2019-12-31 LAB — PROGESTERONE: Progesterone: 4.2 ng/mL

## 2019-12-31 LAB — ESTRADIOL: Estradiol: 77.4 pg/mL

## 2019-12-31 LAB — FSH/LH
FSH: 20.9 m[IU]/mL
LH: 25.9 m[IU]/mL

## 2019-12-31 LAB — VITAMIN B12: Vitamin B-12: 199 pg/mL — ABNORMAL LOW (ref 232–1245)

## 2019-12-31 LAB — LIPID PANEL
Chol/HDL Ratio: 3 ratio (ref 0.0–4.4)
Cholesterol, Total: 160 mg/dL (ref 100–199)
HDL: 54 mg/dL (ref >39)
LDL Chol Calc (NIH): 91 mg/dL (ref 0–99)
Triglycerides: 78 mg/dL (ref 0–149)
VLDL Cholesterol Cal: 15 mg/dL (ref 5–40)

## 2019-12-31 LAB — HEPATITIS C ANTIBODY: Hep C Virus Ab: <0.1 s/co ratio (ref 0.0–0.9)

## 2019-12-31 LAB — VITAMIN D 25 HYDROXY (VIT D DEFICIENCY, FRACTURES): Vit D, 25-Hydroxy: 23 ng/mL — ABNORMAL LOW (ref 30.0–100.0)

## 2020-01-03 LAB — CERVICOVAGINAL ANCILLARY ONLY
Chlamydia: NEGATIVE
Comment: NEGATIVE
Comment: NEGATIVE
Comment: NORMAL
Neisseria Gonorrhea: NEGATIVE
Trichomonas: NEGATIVE

## 2020-01-18 ENCOUNTER — Telehealth: Payer: Self-pay

## 2020-01-18 NOTE — Telephone Encounter (Signed)
Spent 15 minutes on the phone with pt. Spoke to pt concerning her call to the office. Pt stated that her test results stated that she had BV and no one informed her of that. Reviewed test results with pt and noticed that she was looking at 12/28/2018. Pt asked a lot of questions concerning her vit levels and why she couldn't take a rx form of vit d and which one would be better for her to take an injection or pills. Pt was informed that it was up to her which one she wanted. Pt stated that she would like to have the b12 injection.

## 2020-01-18 NOTE — Telephone Encounter (Signed)
Pt called and stated that she is waiting for a call from the nurse and that its been 3 weeks since she did her blood work. The pt is requesting a call back. I told the pt I will send a message to the nurse and to please allow 24 to 48 hours for a reply. The pt verbally understood. Please advise

## 2020-01-19 NOTE — Telephone Encounter (Signed)
She can have a prescription for the b12 injection.  She will need a total of 3 monthly injections and we will recheck her levels in the 4th month. Her Vitamin D levels were much lower in 2020 (14) compared to now (23).  Normal value is 30. This is why she doesn't need the prescription strength dose this time and can manage with over the counter dosing.

## 2020-01-19 NOTE — Telephone Encounter (Signed)
Hello Dr. Valentino Saxon, Question will she be taking this medication monthly or just this one time dose. If monthly do she need a Rx? Thanks [employer]

## 2020-01-20 ENCOUNTER — Ambulatory Visit (INDEPENDENT_AMBULATORY_CARE_PROVIDER_SITE_OTHER): Payer: Medicaid Other

## 2020-01-20 ENCOUNTER — Other Ambulatory Visit: Payer: Self-pay

## 2020-01-20 VITALS — Ht 64.0 in

## 2020-01-20 DIAGNOSIS — E538 Deficiency of other specified B group vitamins: Secondary | ICD-10-CM | POA: Diagnosis not present

## 2020-01-20 DIAGNOSIS — R5383 Other fatigue: Secondary | ICD-10-CM

## 2020-01-20 MED ORDER — CYANOCOBALAMIN 1000 MCG/ML IJ SOLN: 1000.0000 ug | Freq: Once | INTRAMUSCULAR | 2 refills | Status: AC

## 2020-01-20 MED ORDER — CYANOCOBALAMIN 1000 MCG/ML IJ SOLN
1000.0000 ug | Freq: Once | INTRAMUSCULAR | Status: AC
Start: 2020-01-20 — End: 2020-01-20
  Administered 2020-01-20: 1000 ug via INTRAMUSCULAR

## 2020-01-20 NOTE — Progress Notes (Signed)
Pt present for B12 injection for fatigue. Pt stated that she has been feeling really tried and leaking energy.  Pt is take 3 doses of B12 at 4 month pt needs to have b12 labs done along with possible labs of vit d.

## 2020-01-20 NOTE — Telephone Encounter (Signed)
Pt was informed of the information given by [facility] during her office visit today for her b12 injection. Pt voiced that  she understood.

## 2020-02-20 ENCOUNTER — Ambulatory Visit: Payer: Medicaid Other

## 2020-02-21 ENCOUNTER — Telehealth: Payer: Self-pay

## 2020-02-21 ENCOUNTER — Other Ambulatory Visit: Payer: Self-pay

## 2020-02-21 ENCOUNTER — Ambulatory Visit (INDEPENDENT_AMBULATORY_CARE_PROVIDER_SITE_OTHER): Payer: Medicaid Other | Admitting: Surgical

## 2020-02-21 DIAGNOSIS — R5383 Other fatigue: Secondary | ICD-10-CM

## 2020-02-21 MED ORDER — CYANOCOBALAMIN 1000 MCG/ML IJ SOLN
1000.0000 ug | Freq: Once | INTRAMUSCULAR | Status: AC
  Administered 2020-02-21: 1000 ug via INTRAMUSCULAR

## 2020-02-21 NOTE — Telephone Encounter (Signed)
Pt was checking out. The pt made an appt for mobile mammogram does a order need to be placed?  As well as the pt wanted to know does she need to make another B-12 injection appt? Please advise

## 2020-02-21 NOTE — Progress Notes (Signed)
Patient comes in today for second b 12 injection for fatigue. Patient states that she has not noticed a difference since having the B12 injections.

## 2020-02-21 NOTE — Telephone Encounter (Signed)
Please inform pt that she already has an order for a mammogram and she needs to be schedule for another b12 injection in 1 month. Thanks [employer]

## 2020-03-13 ENCOUNTER — Ambulatory Visit
Admission: RE | Admit: 2020-03-13 | Discharge: 2020-03-13 | Disposition: A | Discharge status: Home / Self Care | Payer: Medicaid Other | Source: Ambulatory Visit | Attending: Internal Medicine | Admitting: Internal Medicine

## 2020-03-13 DIAGNOSIS — Z1231 Encounter for screening mammogram for malignant neoplasm of breast: Secondary | ICD-10-CM

## 2020-03-23 ENCOUNTER — Other Ambulatory Visit: Payer: Self-pay

## 2020-03-23 ENCOUNTER — Ambulatory Visit (INDEPENDENT_AMBULATORY_CARE_PROVIDER_SITE_OTHER): Payer: Medicaid Other

## 2020-03-23 DIAGNOSIS — R5383 Other fatigue: Secondary | ICD-10-CM

## 2020-03-23 MED ORDER — CYANOCOBALAMIN 1000 MCG/ML IJ SOLN
1000.0000 ug | Freq: Once | INTRAMUSCULAR | Status: AC
Start: 2020-03-23 — End: 2020-03-23
  Administered 2020-03-23: 1000 ug via INTRAMUSCULAR

## 2020-03-23 NOTE — Progress Notes (Signed)
Pt present for b12 injection to treat fatigue. Pt stated that she was doing well.

## 2020-06-25 ENCOUNTER — Other Ambulatory Visit: Payer: Medicaid Other

## 2020-06-25 ENCOUNTER — Other Ambulatory Visit: Payer: Self-pay

## 2020-06-25 DIAGNOSIS — Z8639 Personal history of other endocrine, nutritional and metabolic disease: Secondary | ICD-10-CM

## 2020-06-25 DIAGNOSIS — R5383 Other fatigue: Secondary | ICD-10-CM

## 2020-06-26 ENCOUNTER — Other Ambulatory Visit: Payer: Self-pay | Admitting: Obstetrics and Gynecology

## 2020-06-26 LAB — VITAMIN B12: Vitamin B-12: 210 pg/mL — ABNORMAL LOW (ref 232–1245)

## 2020-06-26 LAB — VITAMIN D 25 HYDROXY (VIT D DEFICIENCY, FRACTURES): Vit D, 25-Hydroxy: 27.3 ng/mL — ABNORMAL LOW (ref 30.0–100.0)

## 2020-06-26 MED ORDER — CYANOCOBALAMIN 1000 MCG/ML IJ SOLN: 1000.0000 ug | INTRAMUSCULAR | 1 refills | Status: DC

## 2020-07-09 ENCOUNTER — Telehealth: Payer: Self-pay | Admitting: Obstetrics and Gynecology

## 2020-07-09 NOTE — Telephone Encounter (Signed)
Pt states that she needs a vitamin B12 injection but was not sure when she needed to schedule that for. Please Advise.

## 2020-07-09 NOTE — Telephone Encounter (Signed)
Pt called she has questions about medication that have been rx and if there was another one that is suppose to be prescribed. Please Advise.

## 2020-07-10 NOTE — Telephone Encounter (Signed)
Spoke to pt concerning her call to the office. Read message sent to pt via mychart by [facility]. Pt was informed that [facility] stated that her Vit D levels were improving and her Vit B levels also improved but requested that she take 1-2 more injections of Vit B12 and that the medication was prescribed and sent to her pharmacy. Pt was informed that the B12 was the only medication AC prescribed and the other medication are to be purchased over the counter. Pt voiced that she understood.

## 2020-07-12 ENCOUNTER — Ambulatory Visit (INDEPENDENT_AMBULATORY_CARE_PROVIDER_SITE_OTHER): Payer: Medicaid Other

## 2020-07-12 VITALS — Ht 64.0 in

## 2020-07-12 DIAGNOSIS — R5383 Other fatigue: Secondary | ICD-10-CM | POA: Diagnosis not present

## 2020-07-12 MED ORDER — CYANOCOBALAMIN 1000 MCG/ML IJ SOLN
1000.0000 ug | Freq: Once | INTRAMUSCULAR | Status: AC
  Administered 2020-07-12: 1000 ug via INTRAMUSCULAR

## 2020-07-12 NOTE — Progress Notes (Signed)
Pt present for B12 injection.  A B12 injection was given in the right deltoid.  Pt supplied B12 medication.

## 2020-08-10 ENCOUNTER — Ambulatory Visit: Payer: Medicaid Other

## 2020-08-13 ENCOUNTER — Ambulatory Visit: Payer: Medicaid Other

## 2020-08-24 ENCOUNTER — Encounter: Payer: Self-pay | Admitting: Obstetrics and Gynecology

## 2020-09-03 ENCOUNTER — Encounter: Payer: Self-pay | Admitting: Obstetrics and Gynecology

## 2020-09-10 ENCOUNTER — Ambulatory Visit (INDEPENDENT_AMBULATORY_CARE_PROVIDER_SITE_OTHER): Payer: Medicaid Other

## 2020-09-10 ENCOUNTER — Other Ambulatory Visit: Payer: Self-pay

## 2020-09-10 ENCOUNTER — Ambulatory Visit
Admission: EM | Admit: 2020-09-10 | Discharge: 2020-09-10 | Disposition: A | Discharge status: Home / Self Care | Payer: Medicaid Other | Attending: Family Medicine | Admitting: Family Medicine

## 2020-09-10 DIAGNOSIS — S92425A Nondisplaced fracture of distal phalanx of left great toe, initial encounter for closed fracture: Secondary | ICD-10-CM

## 2020-09-10 DIAGNOSIS — S9782XA Crushing injury of left foot, initial encounter: Secondary | ICD-10-CM | POA: Diagnosis not present

## 2020-09-10 MED ORDER — TRAMADOL HCL 50 MG PO TABS: 50.0000 mg | ORAL_TABLET | Freq: Three times a day (TID) | ORAL | 0 refills | Status: DC | PRN

## 2020-09-10 NOTE — ED Triage Notes (Signed)
Pt c/o foot pain along with big toe pain on the left side, last night. Pt states that a piece of big wood fell on her foot.

## 2020-09-10 NOTE — Discharge Instructions (Addendum)
Rest, ice, elevation.  Pain medication as needed.  Follow up with podiatry.  Take care  Dr. Adriana Simas

## 2020-09-10 NOTE — ED Provider Notes (Signed)
[facility]    CSN: [id number] Arrival date & time: 09/10/20  1445      History   Chief Complaint Chief Complaint  Patient presents with   Toe Pain    HPI 50 year old female presents with the above complaints.   Patient reports that she dropped a heavy table on her foot last night.  She states that she is having severe pain, 10/10 in severity.  Pain is predominantly at the first and second toes of the left foot.  She reports some associated swelling and bruising.  She has taken over-the-counter ibuprofen without relief.  Exacerbated by activity and pressure.  Denies ankle pain.  No other reported injuries.  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) 11/27/2016   STD exposure 11/26/2016   Pelvic pain 10/22/2016   History of cervical dysplasia 10/22/2016   History of D&C 10/22/2016   History of tubal ligation 10/22/2016   Tobacco user 10/22/2016    Past Surgical History:  Procedure Laterality Date   BACK SURGERY     CERVICAL BIOPSY  W/ LOOP ELECTRODE EXCISION     CESAREAN SECTION     CHOLECYSTECTOMY     CRYOTHERAPY     DILATION AND CURETTAGE OF UTERUS     LAPAROSCOPY     TUBAL LIGATION  2006    OB History     Gravida  6   Para  4   Term  2   Preterm  2   AB  2   Living  3      SAB      IAB  2   Ectopic      Multiple      Live Births  3            Home Medications    Prior to Admission medications   Medication Sig Start Date End Date Taking? Authorizing Provider  cholecalciferol (VITAMIN D3) 25 MCG (1000 UNIT) tablet Take 1,000 Units by  mouth daily. Take 2 tablets once daily.   Yes [provider]  cyanocobalamin (,VITAMIN B-12,) 1000 MCG/ML injection Inject 1 mL (1,000 mcg total) into the muscle every 30 (thirty) days. 06/26/20  Yes Hildred Laser, MD  ELDERBERRY PO Take by mouth.   Yes [provider]  traMADol (ULTRAM) 50 MG tablet Take 1 tablet (50 mg total) by mouth every 8 (eight) hours as needed. 09/10/20  Yes Tommie Sams, DO    Family History Family History  Problem Relation Age of Onset   Diabetes Mother    Colon cancer Maternal Grandmother    Diabetes Daughter    Prostate cancer Father    Breast cancer Neg Hx    Ovarian cancer Neg Hx     Social History Social History   Tobacco Use   Smoking status: Every Day    Packs/day: 0.50    Types: Cigarettes   Smokeless tobacco: Never  Vaping Use   Vaping Use: Never used  Substance Use Topics   Alcohol use: No   Drug use: No  Allergies   Vicoprofen [hydrocodone-ibuprofen]   Review of Systems Review of Systems  Constitutional: Negative.   Musculoskeletal:        Foot pain, toe pain    Physical Exam Triage Vital Signs ED Triage Vitals  Enc Vitals Group     BP 09/10/20 1526 121/71     Pulse Rate 09/10/20 1526 75     Resp 09/10/20 1526 16     Temp 09/10/20 1526 98.4 F (36.9 C)     Temp Source 09/10/20 1526 Oral     SpO2 09/10/20 1526 100 %     Weight 09/10/20 1525 150 lb (68 kg)     Height --      Head Circumference --      Peak Flow --      Pain Score 09/10/20 1525 10     Pain Loc --      Pain Edu? --      Excl. in GC? --    Updated Vital Signs BP 121/71 (BP Location: Left Arm)   Pulse 75   Temp 98.4 F (36.9 C) (Oral)   Resp 16   Wt 68 kg   LMP 09/04/2020   SpO2 100%   BMI 25.75 kg/m   Visual Acuity Right Eye Distance:   Left Eye Distance:   Bilateral Distance:    Right Eye Near:   Left Eye Near:    Bilateral Near:     Physical Exam Vitals and nursing note reviewed.  Constitutional:       General: She is not in acute distress.    Appearance: Normal appearance. She is not ill-appearing.  HENT:     Head: Normocephalic and atraumatic.  Eyes:     General:        Right eye: No discharge.        Left eye: No discharge.     Conjunctiva/sclera: Conjunctivae normal.  Pulmonary:     Effort: Pulmonary effort is normal. No respiratory distress.  Musculoskeletal:     Comments: Left foot -tenderness over the left great toe.  There is some swelling distally.  Bruising noted on the plantar aspect of the left great toe.  Neurological:     Mental Status: She is alert.  Psychiatric:        Mood and Affect: Mood normal.        Behavior: Behavior normal.     UC Treatments / Results  Labs (all labs ordered are listed, but only abnormal results are displayed) Labs Reviewed - No data to display  EKG   Radiology DG Foot Complete Left  Result Date: 09/10/2020 CLINICAL DATA:  Crush injury yesterday. Left foot pain mostly at the first and second toes. EXAM: LEFT FOOT - COMPLETE 3+ VIEW COMPARISON:  None. FINDINGS: There is an oblique, nondisplaced, intra-articular fracture of the base of the distal phalanx of the great toe. There is no dislocation. Soft tissue swelling is noted in the forefoot. IMPRESSION: Nondisplaced fracture of the distal phalanx of the great toe. Electronically Signed   By: Sebastian Ache M.D.   On: 09/10/2020 16:44    Procedures Procedures (including critical care time)  Medications Ordered in UC Medications - No data to display  Initial Impression / Assessment and Plan / UC Course  I have reviewed the triage vital signs and the nursing notes.  Pertinent labs & imaging results that were available during my care of the patient were reviewed by me and considered in my medical decision making (see  chart for details).    50 year old female presents with an injury to the toes/foot.  X-ray of the left foot was obtained and was independently reviewed by me.   Nondisplaced fracture of the distal phalanx of the left great toe.  Toes were buddy taped.  Placed in postop shoe.  Tramadol as needed for pain.  Follow-up with podiatry.  Final Clinical Impressions(s) / UC Diagnoses   Final diagnoses:  Closed nondisplaced fracture of distal phalanx of left great toe, initial encounter     Discharge Instructions      Rest, ice, elevation.  Pain medication as needed.  Follow up with podiatry.  Take care  Dr. Adriana Simas    ED Prescriptions     Medication Sig Dispense Auth. Provider   traMADol (ULTRAM) 50 MG tablet Take 1 tablet (50 mg total) by mouth every 8 (eight) hours as needed. 15 tablet Everlene Other G, DO      I have reviewed the PDMP during this encounter.   Tommie Sams, [place] 09/10/20 1956

## 2020-12-31 NOTE — Patient Instructions (Signed)
Preventive Care 92-48 Years Old, Female Preventive care refers to lifestyle choices and visits with your health care provider that can promote health and wellness. Preventive care visits are also called wellness exams. What can I expect for my preventive care visit? Counseling Your health care provider may ask you questions about your: Medical history, including: Past medical problems. Family medical history. Pregnancy history. Current health, including: Menstrual cycle. Method of birth control. Emotional well-being. Home life and relationship well-being. Sexual activity and sexual health. Lifestyle, including: Alcohol, nicotine or tobacco, and drug use. Access to firearms. Diet, exercise, and sleep habits. Work and work Statistician. Sunscreen use. Safety issues such as seatbelt and bike helmet use. Physical exam Your health care provider will check your: Height and weight. These may be used to calculate your BMI (body mass index). BMI is a measurement that tells if you are at a healthy weight. Waist circumference. This measures the distance around your waistline. This measurement also tells if you are at a healthy weight and may help predict your risk of certain diseases, such as type 2 diabetes and high blood pressure. Heart rate and blood pressure. Body temperature. Skin for abnormal spots. What immunizations do I need? Vaccines are usually given at various ages, according to a schedule. Your health care provider will recommend vaccines for you based on your age, medical history, and lifestyle or other factors, such as travel or where you work. What tests do I need? Screening Your health care provider may recommend screening tests for certain conditions. This may include: Lipid and cholesterol levels. Diabetes screening. This is done by checking your blood sugar (glucose) after you have not eaten for a while (fasting). Pelvic exam and Pap test. Hepatitis B test. Hepatitis C  test. HIV (human immunodeficiency virus) test. STI (sexually transmitted infection) testing, if you are at risk. Lung cancer screening. Colorectal cancer screening. Mammogram. Talk with your health care provider about when you should start having regular mammograms. This may depend on whether you have a family history of breast cancer. BRCA-related cancer screening. This may be done if you have a family history of breast, ovarian, tubal, or peritoneal cancers. Bone density scan. This is done to screen for osteoporosis. Talk with your health care provider about your test results, treatment options, and if necessary, the need for more tests. Follow these instructions at home: Eating and drinking  Eat a diet that includes fresh fruits and vegetables, whole grains, lean protein, and low-fat dairy products. Take vitamin and mineral supplements as recommended by your health care provider. Do not drink alcohol if: Your health care provider tells you not to drink. You are pregnant, may be pregnant, or are planning to become pregnant. If you drink alcohol: Limit how much you have to 0-1 drink a day. Know how much alcohol is in your drink. In the [place] one drink equals one 12 oz bottle of beer (355 mL), one 5 oz glass of wine (148 mL), or one 1 oz glass of hard liquor (44 mL). Lifestyle Brush your teeth every morning and night with fluoride toothpaste. Floss one time each day. Exercise for at least 30 minutes 5 or more days each week. Do not use any products that contain nicotine or tobacco. These products include cigarettes, chewing tobacco, and vaping devices, such as e-cigarettes. If you need help quitting, ask your health care provider. Do not use drugs. If you are sexually active, practice safe sex. Use a condom or other form of protection to prevent  STIs. If you do not wish to become pregnant, use a form of birth control. If you plan to become pregnant, see your health care provider for a  prepregnancy visit. Take aspirin only as told by your health care provider. Make sure that you understand how much to take and what form to take. Work with your health care provider to find out whether it is safe and beneficial for you to take aspirin daily. Find healthy ways to manage stress, such as: Meditation, yoga, or listening to music. Journaling. Talking to a trusted person. Spending time with friends and family. Minimize exposure to UV radiation to reduce your risk of skin cancer. Safety Always wear your seat belt while driving or riding in a vehicle. Do not drive: If you have been drinking alcohol. Do not ride with someone who has been drinking. When you are tired or distracted. While texting. If you have been using any mind-altering substances or drugs. Wear a helmet and other protective equipment during sports activities. If you have firearms in your house, make sure you follow all gun safety procedures. Seek help if you have been physically or sexually abused. What's next? Visit your health care provider once a year for an annual wellness visit. Ask your health care provider how often you should have your eyes and teeth checked. Stay up to date on all vaccines.   This information is not intended to replace advice given to you by your health care provider. Make sure you discuss any questions you have with your health care provider.  Breast Self-Awareness Breast self-awareness is knowing how your breasts look and feel. Doing breast self-awareness is important. It allows you to catch a breast problem early while it is still small and can be treated. All women should do breast self-awareness, including women who have had breast implants. Tell your doctor if you notice a change in your breasts. What you need: A mirror. A well-lit room. How to do a breast self-exam A breast self-exam is one way to learn what is normal for your breasts and to check for changes. To do a breast  self-exam: Look for changes  Take off all the clothes above your waist. Stand in front of a mirror in a room with good lighting. Put your hands on your hips. Push your hands down. Look at your breasts and nipples in the mirror to see if one breast or nipple looks different from the other. Check to see if: The shape of one breast is different. The size of one breast is different. There are wrinkles, dips, and bumps in one breast and not the other. Look at each breast for changes in the skin, such as: Redness. Scaly areas. Look for changes in your nipples, such as: Liquid around the nipples. Bleeding. Dimpling. Redness. A change in where the nipples are. Feel for changes  Lie on your back on the floor. Feel each breast. To do this, follow these steps: Pick a breast to feel. Put the arm closest to that breast above your head. Use your other arm to feel the nipple area of your breast. Feel the area with the pads of your three middle fingers by making small circles with your fingers. For the first circle, press lightly. For the second circle, press harder. For the third circle, press even harder. Keep making circles with your fingers at the different pressures as you move down your breast. Stop when you feel your ribs. Move your fingers a little toward the  center of your body. Start making circles with your fingers again, this time going up until you reach your collarbone. Keep making up-and-down circles until you reach your armpit. Remember to keep using the three pressures. Feel the other breast in the same way. Sit or stand in the tub or shower. With soapy water on your skin, feel each breast the same way you did in step 2 when you were lying on the floor. Write down what you find Writing down what you find can help you remember what to tell your doctor. Write down: What is normal for each breast. Any changes you find in each breast, including: The kind of changes you  find. Whether you have pain. Size and location of any lumps. When you last had your menstrual period. General tips Check your breasts every month. If you are breastfeeding, the best time to check your breasts is after you feed your baby or after you use a breast pump. If you get menstrual periods, the best time to check your breasts is 5-7 days after your menstrual period is over. With time, you will become comfortable with the self-exam, and you will begin to know if there are changes in your breasts. Contact a doctor if you: See a change in the shape or size of your breasts or nipples. See a change in the skin of your breast or nipples, such as red or scaly skin. Have fluid coming from your nipples that is not normal. Find a lump or thick area that was not there before. Have pain in your breasts. Have any concerns about your breast health. Summary Breast self-awareness includes looking for changes in your breasts, as well as feeling for changes within your breasts. Breast self-awareness should be done in front of a mirror in a well-lit room. You should check your breasts every month. If you get menstrual periods, the best time to check your breasts is 5-7 days after your menstrual period is over. Let your doctor know of any changes you see in your breasts, including changes in size, changes on the skin, pain or tenderness, or fluid from your nipples that is not normal. This information is not intended to replace advice given to you by your health care provider. Make sure you discuss any questions you have with your health care provider. Document Revised: 09/22/2017 Document Reviewed: 09/22/2017 Elsevier Patient Education  [place] Revised: 08/01/2020 Document Reviewed: 08/01/2020 Elsevier Patient Education  2022 [employer].

## 2021-01-01 ENCOUNTER — Encounter: Payer: Self-pay | Admitting: Obstetrics and Gynecology

## 2021-01-01 ENCOUNTER — Ambulatory Visit (INDEPENDENT_AMBULATORY_CARE_PROVIDER_SITE_OTHER): Payer: Medicaid Other | Admitting: Obstetrics and Gynecology

## 2021-01-01 ENCOUNTER — Other Ambulatory Visit: Payer: Self-pay

## 2021-01-01 VITALS — BP 125/88 | HR 78 | Ht 64.0 in | Wt 153.1 lb

## 2021-01-01 DIAGNOSIS — Z1211 Encounter for screening for malignant neoplasm of colon: Secondary | ICD-10-CM

## 2021-01-01 DIAGNOSIS — Z1231 Encounter for screening mammogram for malignant neoplasm of breast: Secondary | ICD-10-CM

## 2021-01-01 DIAGNOSIS — Z01419 Encounter for gynecological examination (general) (routine) without abnormal findings: Secondary | ICD-10-CM

## 2021-01-01 DIAGNOSIS — N951 Menopausal and female climacteric states: Secondary | ICD-10-CM

## 2021-01-01 DIAGNOSIS — Z113 Encounter for screening for infections with a predominantly sexual mode of transmission: Secondary | ICD-10-CM

## 2021-01-01 DIAGNOSIS — Z8639 Personal history of other endocrine, nutritional and metabolic disease: Secondary | ICD-10-CM

## 2021-01-01 DIAGNOSIS — N924 Excessive bleeding in the premenopausal period: Secondary | ICD-10-CM

## 2021-01-01 DIAGNOSIS — E538 Deficiency of other specified B group vitamins: Secondary | ICD-10-CM

## 2021-01-01 DIAGNOSIS — Z23 Encounter for immunization: Secondary | ICD-10-CM | POA: Diagnosis not present

## 2021-01-01 MED ORDER — TETANUS-DIPHTH-ACELL PERTUSSIS 5-2.5-18.5 LF-MCG/0.5 IM SUSY
0.5000 mL | PREFILLED_SYRINGE | Freq: Once | INTRAMUSCULAR | Status: AC
  Administered 2021-01-01: 0.5 mL via INTRAMUSCULAR

## 2021-01-01 MED ORDER — CYANOCOBALAMIN 1000 MCG/ML IJ SOLN
1000.0000 ug | Freq: Once | INTRAMUSCULAR | Status: AC
  Administered 2021-01-01: 1000 ug via INTRAMUSCULAR

## 2021-01-01 NOTE — Progress Notes (Signed)
GYNECOLOGY ANNUAL PHYSICAL EXAM PROGRESS NOTE  Subjective:    Theresa Ryan is a 50 y.o. [id number] female who presents for an annual exam.  The patient is sexually active (new partner since earlier this year). The patient wears seatbelts: yes. The patient participates in regular exercise: yes. Has the patient ever been transfused or tattooed?: yes (professional tattoos). The patient reports that there is not domestic violence in her life.   The patient has the following concerns today: Patient stated she had a month where she felt like she was pregnant. Has been noting breast tenderness, nausea, and did not "feel like herself".  Then had heavy bleeding and cramping with passage of clots.  Does have a history of BTL. Did not take a pregnancy test.  Patient reported 2 cycles that month.  All of this occurred in June.    Gynecologic History  Menarche age: 65 LMP 12/31/2020. Contraception: condoms and h/o tubal ligation History of STI's: Trichomonas infection in 2019, treated. H/o HSV.  Last Pap: 12/30/2018. Results were: normal.  Notes h/o abnormal pap smear x 1, NILM with HR HPV+ in 2018.  Also remote h/o in 1995 of abnormal pap. H/o LEEP.  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    Past Surgical History:  Procedure  Laterality Date   BACK SURGERY     CERVICAL BIOPSY  W/ LOOP ELECTRODE EXCISION     CESAREAN SECTION     CHOLECYSTECTOMY     CRYOTHERAPY     DILATION AND CURETTAGE OF UTERUS     LAPAROSCOPY     TUBAL LIGATION  2006    Family History  Problem Relation Age of Onset   Diabetes Mother    Colon cancer Maternal Grandmother    Diabetes Daughter    Prostate cancer Father    Breast cancer Neg Hx    Ovarian cancer Neg Hx     Social History   Socioeconomic History   Marital status: Single    Spouse name: Not on file   Number of children: Not on file   Years of education: Not on file   Highest education level: Not on file  Occupational History   Not on file  Tobacco Use   Smoking status: Every Day    Packs/day: 0.50    Types: Cigarettes   Smokeless tobacco: Never  Vaping Use   Vaping Use: Never used  Substance and Sexual Activity   Alcohol use: No  Drug use: No   Sexual activity: Yes    Birth control/protection: Surgical, Condom  Other Topics Concern   Not on file  Social History Narrative   Not on file   Social Determinants of Health   Financial Resource Strain: Not on file  Food Insecurity: Not on file  Transportation Needs: Not on file  Physical Activity: Not on file  Stress: Not on file  Social Connections: Not on file  Intimate Partner Violence: Not on file    Current Outpatient Medications on File Prior to Visit  Medication Sig Dispense Refill   cholecalciferol (VITAMIN D3) 25 MCG (1000 UNIT) tablet Take 1,000 Units by mouth daily. Take 2 tablets once daily.     cyanocobalamin (,VITAMIN B-12,) 1000 MCG/ML injection Inject 1 mL (1,000 mcg total) into the muscle every 30 (thirty) days. 1 mL 1   ELDERBERRY PO Take by mouth.     traMADol (ULTRAM) 50 MG tablet Take 1 tablet (50 mg total) by mouth every 8 (eight) hours as needed. 15 tablet 0   No current facility-administered medications on file prior to visit.    Allergies  Allergen Reactions   Vicoprofen  [Hydrocodone-Ibuprofen] Other (See Comments)    Chest pains    Review of Systems Constitutional: negative for chills, fatigue, fevers and sweats Eyes: negative for irritation, redness and visual disturbance Ears, nose, mouth, throat, and face: negative for hearing loss, nasal congestion, snoring and tinnitus Respiratory: negative for asthma, cough, sputum Cardiovascular: negative for chest pain, dyspnea, exertional chest pressure/discomfort, irregular heart beat, palpitations and syncope Gastrointestinal: negative for abdominal pain, change in bowel habits, nausea and vomiting Genitourinary: Negative for sexual problems and vaginal discharge, dysuria and urinary incontinence.  Integument/breast: negative for breast lump, breast tenderness and nipple discharge Hematologic/lymphatic: negative for bleeding and easy bruising Musculoskeletal:negative for back pain and muscle weakness Neurological: negative for dizziness, headaches, vertigo and weakness Endocrine: negative for diabetic symptoms including polydipsia, polyuria and skin dryness Allergic/Immunologic: negative for hay fever and urticaria       Objective:  BP 125/88   Pulse 78   Ht 5\' 4"  (1.626 m)   Wt 153 lb 1.6 oz (69.4 kg)   LMP 12/31/2020 (Exact Date)   BMI 26.28 kg/m    General Appearance:    Alert, cooperative, no distress, appears stated age  Head:    Normocephalic, without obvious abnormality, atraumatic  Eyes:    PERRL, conjunctiva/corneas clear, EOM's intact, both eyes  Ears:    Normal external ear canals, both ears  Nose:   Nares normal, septum midline, mucosa normal, no drainage or sinus tenderness  Throat:   Lips, mucosa, and tongue normal; teeth and gums normal  Neck:   Supple, symmetrical, trachea midline, no adenopathy; thyroid: no enlargement/tenderness/nodules; no carotid bruit or JVD  Back:     Symmetric, no curvature, ROM normal, no CVA tenderness  Lungs:     Clear to auscultation bilaterally,  respirations unlabored  Chest Wall:    No tenderness or deformity   Heart:    Regular rate and rhythm, S1 and S2 normal, no murmur, rub or gallop  Breast Exam:    No tenderness, masses, or nipple abnormality  Abdomen:     Soft, non-tender, bowel sounds active all four quadrants, no masses, no organomegaly.    Genitalia:    Pelvic:external genitalia normal. Vagina without lesions, discharge, or tenderness, rectovaginal septum normal. Small amount of dark red blood in vaginal vault. Cervix normal in appearance, no cervical motion tenderness,  no adnexal masses or tenderness.  Uterus normal size, shape, mobile, regular contours, nontender.  Rectal:    Normal external sphincter.  No hemorrhoids appreciated. Internal exam not done.   Extremities:   Extremities normal, atraumatic, no cyanosis or edema  Pulses:   2+ and symmetric all extremities  Skin:   Skin color, texture, turgor normal, no rashes or lesions  Lymph nodes:   Cervical, supraclavicular, and axillary nodes normal  Neurologic:   CNII-XII intact, normal strength, sensation and reflexes throughout   .  Labs:  Lab Results  Component Value Date   WBC 9.7 12/30/2019   HGB 13.1 12/30/2019   HCT 39.4 12/30/2019   MCV 86 12/30/2019   PLT 247 12/30/2019    Lab Results  Component Value Date   CREATININE 0.82 12/30/2019   BUN 6 12/30/2019   NA 139 12/30/2019   K 3.8 12/30/2019   CL 102 12/30/2019   CO2 20 12/30/2019    Lab Results  Component Value Date   ALT 12 12/30/2019   AST 13 12/30/2019   ALKPHOS 64 12/30/2019   BILITOT 0.3 12/30/2019    Lab Results  Component Value Date   TSH 1.330 12/28/2018     Results for orders placed or performed in visit on 06/25/20  Vitamin B12  Result Value Ref Range   Vitamin B-12 210 (L) 232 - 1,245 pg/mL  VITAMIN D 25 Hydroxy (Vit-D Deficiency, Fractures)  Result Value Ref Range   Vit D, 25-Hydroxy 27.3 (L) 30.0 - 100.0 ng/mL    Assessment:   1. Encounter for annual routine  gynecological examination   2. Perimenopausal   3. History of vitamin D deficiency   4. Breast cancer screening by mammogram   5. Abnormal perimenopausal bleeding   6. Vitamin B 12 deficiency   7. Screen for STD (sexually transmitted disease)   8. Colon cancer screening   9. Need for Tdap vaccination    Plan:    - Blood tests: see orders.   - Breast self exam technique reviewed and patient encouraged to perform self-exam monthly. - Contraception: tubal ligation.  Also using condoms for safe sex.  - Discussed healthy lifestyle modifications. - Desires STI testing (cervicitis screen, declines HIV). Will also screen for Hepatitis C today as patient has never had screening, per CDC recommendations.  - Pap smear normal, continue routine screening q 3-5 years.  - Mammogram ordered. - Colon cancer screening: Colonoscopy ordered in light of family h/o polyps.  - Declines flu vaccine.  - Declines COVID vaccination.  - Ok to receive Tdap today.  - Patient with h/o Vitamin D and B12 deficiency. Has transitioned to PO supplements for B12 (previously receiving injections, but has 1 more refill which she brought with her today).  Will adminsiter. Will recheck levels.   - Patient with occasional irregular cycles, possibly entering perimenopausal phase. Discussed signs and symptoms of perimenopause. Will check hormone levels today.  - Follow up in 1 year for annual exam.  Follow up sooner if needed.     Rubie Maid, MD [facility]

## 2021-01-02 LAB — CBC
Hematocrit: 38.9 % (ref 34.0–46.6)
Hemoglobin: 13.2 g/dL (ref 11.1–15.9)
MCH: 28.7 pg (ref 26.6–33.0)
MCHC: 33.9 g/dL (ref 31.5–35.7)
MCV: 85 fL (ref 79–97)
Platelets: 242 10*3/uL (ref 150–450)
RBC: 4.6 x10E6/uL (ref 3.77–5.28)
RDW: 11.6 % — ABNORMAL LOW (ref 11.7–15.4)
WBC: 8.6 10*3/uL (ref 3.4–10.8)

## 2021-01-02 LAB — LIPID PANEL
Chol/HDL Ratio: 3.4 ratio (ref 0.0–4.4)
Cholesterol, Total: 154 mg/dL (ref 100–199)
HDL: 45 mg/dL (ref >39)
LDL Chol Calc (NIH): 81 mg/dL (ref 0–99)
Triglycerides: 160 mg/dL — ABNORMAL HIGH (ref 0–149)
VLDL Cholesterol Cal: 28 mg/dL (ref 5–40)

## 2021-01-02 LAB — TSH: TSH: 0.736 u[IU]/mL (ref 0.450–4.500)

## 2021-01-02 LAB — COMPREHENSIVE METABOLIC PANEL
ALT: 11 IU/L (ref 0–32)
AST: 21 IU/L (ref 0–40)
Albumin/Globulin Ratio: 1.5 (ref 1.2–2.2)
Albumin: 4.2 g/dL (ref 3.8–4.8)
Alkaline Phosphatase: 60 IU/L (ref 44–121)
BUN/Creatinine Ratio: 8 — ABNORMAL LOW (ref 9–23)
BUN: 8 mg/dL (ref 6–24)
Bilirubin Total: 0.4 mg/dL (ref 0.0–1.2)
CO2: 22 mmol/L (ref 20–29)
Calcium: 8.8 mg/dL (ref 8.7–10.2)
Chloride: 104 mmol/L (ref 96–106)
Creatinine, Ser: 0.95 mg/dL (ref 0.57–1.00)
Globulin, Total: 2.8 g/dL (ref 1.5–4.5)
Glucose: 75 mg/dL (ref 70–99)
Potassium: 3.9 mmol/L (ref 3.5–5.2)
Sodium: 140 mmol/L (ref 134–144)
Total Protein: 7 g/dL (ref 6.0–8.5)
eGFR: 74 mL/min/{1.73_m2} (ref >59)

## 2021-01-02 LAB — FSH/LH
FSH: 8.5 m[IU]/mL
LH: 6.1 m[IU]/mL

## 2021-01-02 LAB — ESTRADIOL: Estradiol: 33.7 pg/mL

## 2021-01-02 LAB — PROGESTERONE: Progesterone: 0.6 ng/mL

## 2021-01-02 LAB — VITAMIN B12: Vitamin B-12: >2000 pg/mL — ABNORMAL HIGH (ref 232–1245)

## 2021-01-02 LAB — VITAMIN D 25 HYDROXY (VIT D DEFICIENCY, FRACTURES): Vit D, 25-Hydroxy: 35.1 ng/mL (ref 30.0–100.0)

## 2021-01-02 LAB — HEPATITIS C ANTIBODY: Hep C Virus Ab: <0.1 s/co ratio (ref 0.0–0.9)

## 2021-01-02 LAB — RPR: RPR Ser Ql: NONREACTIVE

## 2021-01-02 LAB — HEMOGLOBIN A1C
Est. average glucose Bld gHb Est-mCnc: 103 mg/dL
Hgb A1c MFr Bld: 5.2 % (ref 4.8–5.6)

## 2021-01-03 LAB — GC/CHLAMYDIA PROBE AMP
Chlamydia trachomatis, NAA: NEGATIVE
Neisseria Gonorrhoeae by PCR: NEGATIVE

## 2021-01-03 LAB — TRICHOMONAS VAGINALIS, PROBE AMP: Trich vag by NAA: NEGATIVE

## 2021-01-09 ENCOUNTER — Other Ambulatory Visit: Payer: Self-pay

## 2021-01-09 DIAGNOSIS — Z1211 Encounter for screening for malignant neoplasm of colon: Secondary | ICD-10-CM

## 2021-01-09 MED ORDER — NA SULFATE-K SULFATE-MG SULF 17.5-3.13-1.6 GM/177ML PO SOLN: 1.0000 | Freq: Once | ORAL | 0 refills | Status: AC

## 2021-01-09 NOTE — Progress Notes (Signed)
Gastroenterology Pre-Procedure Review  Request Date: 03/07/2021 Requesting Physician: Dr. Allegra Lai  PATIENT REVIEW QUESTIONS: The patient responded to the following health history questions as indicated:    1. Are you having any GI issues? no 2. Do you have a personal history of Polyps? no 3. Do you have a family history of Colon Cancer or Polyps? yes (Dad- polyps) 4. Diabetes Mellitus? no 5. Joint replacements in the past 12 months?no 6. Major health problems in the past 3 months?no 7. Any artificial heart valves, MVP, or defibrillator?no    MEDICATIONS & ALLERGIES:    Patient reports the following regarding taking any anticoagulation/antiplatelet therapy:   Plavix, Coumadin, Eliquis, Xarelto, Lovenox, Pradaxa, Brilinta, or Effient? no Aspirin? no  Patient confirms/reports the following medications:  Current Outpatient Medications  Medication Sig Dispense Refill   cholecalciferol (VITAMIN D3) 25 MCG (1000 UNIT) tablet Take 1,000 Units by mouth daily. Take 2 tablets once daily.     cyanocobalamin (,VITAMIN B-12,) 1000 MCG/ML injection Inject 1 mL (1,000 mcg total) into the muscle every 30 (thirty) days. 1 mL 1   ELDERBERRY PO Take by mouth.     Multiple Vitamins-Minerals (MULTIVITAMIN WOMEN PO) Take by mouth. 2 gummies per day     No current facility-administered medications for this visit.    Patient confirms/reports the following allergies:  Allergies  Allergen Reactions   Vicoprofen [Hydrocodone-Ibuprofen] Other (See Comments)    Chest pains    No orders of the defined types were placed in this encounter.   AUTHORIZATION INFORMATION Primary Insurance: 1D#: Group #:  Secondary Insurance: 1D#: Group #:  SCHEDULE INFORMATION: Date: 03/07/2021 Time: Location: [facility]

## 2021-02-14 ENCOUNTER — Encounter: Payer: Self-pay | Admitting: Gastroenterology

## 2021-02-21 ENCOUNTER — Encounter: Payer: Self-pay | Admitting: Anesthesiology

## 2021-03-05 ENCOUNTER — Telehealth: Payer: Self-pay | Admitting: Gastroenterology

## 2021-03-05 NOTE — Telephone Encounter (Signed)
Inbound call from pt requested a call back to r/s her procedure. Thank you.

## 2021-03-06 ENCOUNTER — Other Ambulatory Visit: Payer: Self-pay

## 2021-03-06 NOTE — Progress Notes (Signed)
Patient has requested to reschedule procedure to 02/23. Updated instructions will be sent out.

## 2021-03-06 NOTE — Telephone Encounter (Signed)
Returned patients call. Unable to leave message. 

## 2021-04-09 ENCOUNTER — Telehealth: Payer: Self-pay | Admitting: Gastroenterology

## 2021-04-09 NOTE — Telephone Encounter (Signed)
Incoming call  Patient needs to reschedule procedure for 04/11/21

## 2021-04-10 NOTE — Telephone Encounter (Signed)
Returned patients call to reschedule procedure. Unable to leave a message.

## 2021-04-11 ENCOUNTER — Ambulatory Visit: Admission: RE | Admit: 2021-04-11 | Payer: Medicaid Other | Source: Home / Self Care | Admitting: Gastroenterology

## 2021-04-11 HISTORY — DX: Migraine, unspecified, not intractable, without status migrainosus: G43.909

## 2021-04-11 HISTORY — DX: Family history of other specified conditions: Z84.89

## 2021-04-11 SURGERY — COLONOSCOPY WITH PROPOFOL
Anesthesia: Choice

## 2021-08-02 ENCOUNTER — Ambulatory Visit
Admission: EM | Admit: 2021-08-02 | Discharge: 2021-08-02 | Disposition: A | Discharge status: Home / Self Care | Payer: Medicaid Other | Attending: Emergency Medicine | Admitting: Emergency Medicine

## 2021-08-02 DIAGNOSIS — H01004 Unspecified blepharitis left upper eyelid: Secondary | ICD-10-CM | POA: Diagnosis not present

## 2021-08-02 MED ORDER — FLUORESCEIN SODIUM 1 MG OP STRP
1.0000 | ORAL_STRIP | Freq: Once | OPHTHALMIC | Status: AC
  Administered 2021-08-02: 1 via OPHTHALMIC

## 2021-08-02 MED ORDER — TOBRAMYCIN-DEXAMETHASONE 0.3-0.1 % OP SUSP: 2.0000 [drp] | Freq: Four times a day (QID) | OPHTHALMIC | 0 refills | Status: DC

## 2021-08-02 MED ORDER — TETRACAINE HCL 0.5 % OP SOLN
2.0000 [drp] | Freq: Once | OPHTHALMIC | Status: AC
  Administered 2021-08-02: 2 [drp] via OPHTHALMIC

## 2021-08-02 NOTE — Discharge Instructions (Addendum)
Instill 2 drops of TobraDex in your left eye every 6 hours for the next 5 days.  You may continue to apply cool compresses to your eye to help with pain, swelling, and inflammation.  You may also take over-the-counter ibuprofen according to package instructions as needed for pain and inflammation.  If your symptoms are not improved in 48 to 72 hours I recommend that you follow-up with ophthalmology.

## 2021-08-02 NOTE — ED Triage Notes (Signed)
Pt reports stye over her left eye. She was mowing the grass and thinks maybe some grass got into her eye.

## 2021-08-02 NOTE — ED Provider Notes (Signed)
[facility]    CSN: [id number] Arrival date & time: 08/02/21  1903      History   Chief Complaint Chief Complaint  Patient presents with   Eye Problem    HPI Theresa Ryan is a 51 y.o. female.   HPI  51 year old female here for evaluation of left eye complaint.  Patient reports that she was mowing the lawn 5 days ago and had grass blow up in her face several times.  The following morning she noticed that her left eye was slightly irritated and red.  3 days ago she woke up and her upper eyelid was swollen, red, and painful.  She describes her eye as being both itchy and painful when she blinks or moves her eye side to side.  She has been applying cool compresses and rinsing her eye at home but she has not witnessed any for material being flushed from her eye.  She states that she feels like there is a film over her eye and is causing her vision to be blurry.  In the morning she has a yellow discharge near the inner canthus and under her eye.  She says the swelling is improving but it is still present.  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) 11/27/2016   STD exposure 11/26/2016   Pelvic pain 10/22/2016   History of cervical dysplasia 10/22/2016   History of D&C 10/22/2016   History of tubal ligation 10/22/2016   Tobacco user 10/22/2016    Past Surgical History:  Procedure  Laterality Date   BACK SURGERY     CERVICAL BIOPSY  W/ LOOP ELECTRODE EXCISION     CESAREAN SECTION     CHOLECYSTECTOMY     CRYOTHERAPY     DILATION AND CURETTAGE OF UTERUS     LAPAROSCOPY     TUBAL LIGATION  2006    OB History     Gravida  6   Para  4   Term  2   Preterm  2   AB  2   Living  3      SAB      IAB  2   Ectopic      Multiple      Live Births  3            Home Medications    Prior to Admission medications   Medication Sig Start Date End Date Taking? Authorizing Provider  tobramycin-dexamethasone [facility]) ophthalmic solution Place 2 drops into the left eye every 6 (six) hours. 08/02/21  Yes Margarette Canada, NP  cholecalciferol (VITAMIN D3) 25 MCG (1000 UNIT) tablet Take 1,000 Units by mouth daily. Take 2 tablets once daily.    [provider]  cyanocobalamin (,VITAMIN B-12,) 1000 MCG/ML injection Inject 1 mL (1,000 mcg total) into the muscle every 30 (thirty)  days. Patient not taking: Reported on 02/14/2021 06/26/20   Hildred Laser, MD  ELDERBERRY PO Take by mouth.    [provider]  Multiple Vitamins-Minerals (MULTIVITAMIN WOMEN PO) Take by mouth. 2 gummies per day    [provider]    Family History Family History  Problem Relation Age of Onset   Diabetes Mother    Hypertension Father    Prostate cancer Father    Colon cancer Maternal Grandmother    Diabetes Daughter    Breast cancer Neg Hx    Ovarian cancer Neg Hx     Social History Social History   Tobacco Use   Smoking status: Every Day    Packs/day: 0.50    Types: Cigarettes   Smokeless tobacco: Never   Tobacco comments:    Off and on since age 54  Vaping Use   Vaping Use: Never used  Substance Use Topics   Alcohol use: Yes    Comment: occas   Drug use: No     Allergies   Vicoprofen [hydrocodone-ibuprofen]   Review of Systems Review of Systems  Eyes:  Positive for pain, discharge, redness, itching and visual disturbance. Negative  for photophobia.  Skin:  Positive for color change.  Hematological: Negative.   Psychiatric/Behavioral: Negative.       Physical Exam Triage Vital Signs ED Triage Vitals  Enc Vitals Group     BP 08/02/21 1913 (!) 144/87     Pulse Rate 08/02/21 1915 81     Resp 08/02/21 1913 16     Temp 08/02/21 1915 98.3 F (36.8 C)     Temp Source 08/02/21 1915 Oral     SpO2 08/02/21 1913 100 %     Weight --      Height --      Head Circumference --      Peak Flow --      Pain Score --      Pain Loc --      Pain Edu? --      Excl. in GC? --    No data found.  Updated Vital Signs BP (!) 144/87 (BP Location: Left Arm)   Pulse 81   Temp 98.3 F (36.8 C) (Oral)   Resp 16   SpO2 100%   Visual Acuity Right Eye Distance:   Left Eye Distance:   Bilateral Distance:    Right Eye Near:   Left Eye Near:    Bilateral Near:     Physical Exam Vitals and nursing note reviewed.  Constitutional:      Appearance: Normal appearance. She is not ill-appearing.  HENT:     Head: Normocephalic and atraumatic.  Eyes:     General: No scleral icterus.       Right eye: No discharge.        Left eye: No discharge.     Extraocular Movements: Extraocular movements intact.     Conjunctiva/sclera: Conjunctivae normal.     Pupils: Pupils are equal, round, and reactive to light.  Neurological:     Mental Status: She is alert.      UC Treatments / Results  Labs (all labs ordered are listed, but only abnormal results are displayed) Labs Reviewed - No data to display  EKG   Radiology No results found.  Procedures Procedures (including critical care time)  Medications Ordered in UC Medications  tetracaine (PONTOCAINE) 0.5 % ophthalmic solution 2 drop (2 drops Left Eye Given by Other 08/02/21 1945)  fluorescein ophthalmic  strip 1 strip (1 strip Left Eye Given by Other 08/02/21 1945)    Initial Impression / Assessment and Plan / UC Course  I have reviewed the triage vital signs and the  nursing notes.  Pertinent labs & imaging results that were available during my care of the patient were reviewed by me and considered in my medical decision making (see chart for details).  Patient is a very pleasant, nontoxic-appearing 51 year old female here for evaluation of left upper eyelid swelling, eye pain, eye itching, and eye drainage that been going on for the past 3 to 4 days.  On exam patient has erythema and edema of her left upper eyelid.  The left lower eyelid is normal in appearance.  Both pupils are equal round and reactive and she has normal red light reflex in both eyes.  Bulbar and labral conjunctiva of the left eye are normal in appearance.  I anesthetized the eye with 2 drops of tetracaine and inverted the upper eyelid to look for the presence of any foreign bodies or air internal hordeolum's, neither of which were present.  I then stained the eye with fluorescein dye and examined with a Woods lamp under ultraviolet light.  There is no corneal abrasions or corneal defects noted on exam.  Given the patient's upper eyelid swelling and inflammation I will treat her for blepharitis with TobraDex eyedrops, 2 drops in her left eye every 6 hours for 5 days.  If her symptoms not improved within 40 to 72 hours eyedrops I suggested that she follow-up with ophthalmology.   Final Clinical Impressions(s) / UC Diagnoses   Final diagnoses:  Blepharitis of left upper eyelid, unspecified type   Discharge Instructions   None    ED Prescriptions     Medication Sig Dispense Auth. Provider   tobramycin-dexamethasone [facility]) ophthalmic solution Place 2 drops into the left eye every 6 (six) hours. 5 mL Becky Augusta, NP      PDMP not reviewed this encounter.   Becky Augusta, NP 08/02/21 1946

## 2021-10-28 ENCOUNTER — Ambulatory Visit
Admission: EM | Admit: 2021-10-28 | Discharge: 2021-10-28 | Disposition: A | Discharge status: Home / Self Care | Payer: Medicaid Other | Attending: Physician Assistant | Admitting: Physician Assistant

## 2021-10-28 DIAGNOSIS — J069 Acute upper respiratory infection, unspecified: Secondary | ICD-10-CM | POA: Insufficient documentation

## 2021-10-28 DIAGNOSIS — J029 Acute pharyngitis, unspecified: Secondary | ICD-10-CM | POA: Insufficient documentation

## 2021-10-28 DIAGNOSIS — Z1152 Encounter for screening for COVID-19: Secondary | ICD-10-CM | POA: Diagnosis present

## 2021-10-28 DIAGNOSIS — R051 Acute cough: Secondary | ICD-10-CM | POA: Insufficient documentation

## 2021-10-28 LAB — RESP PANEL BY RT-PCR (FLU A&B, COVID) ARPGX2
Influenza A by PCR: NEGATIVE
Influenza B by PCR: NEGATIVE
SARS Coronavirus 2 by RT PCR: NEGATIVE

## 2021-10-28 MED ORDER — PROMETHAZINE-DM 6.25-15 MG/5ML PO SYRP: 5.0000 mL | ORAL_SOLUTION | Freq: Four times a day (QID) | ORAL | 0 refills | Status: DC | PRN

## 2021-10-28 MED ORDER — IPRATROPIUM BROMIDE 0.06 % NA SOLN: 2.0000 | Freq: Four times a day (QID) | NASAL | 0 refills | Status: DC

## 2021-10-28 NOTE — Discharge Instructions (Addendum)
-  We will call if your COVID or flu test are positive.  URI/COLD SYMPTOMS: Your exam today is consistent with a viral illness. Antibiotics are not indicated at this time. Use medications as directed, including cough syrup, nasal saline, and decongestants. Your symptoms should improve over the next few days and resolve within 7-10 days. Increase rest and fluids. F/u if symptoms worsen or predominate such as sore throat, ear pain, productive cough, shortness of breath, or if you develop high fevers or worsening fatigue over the next several days.

## 2021-10-28 NOTE — ED Triage Notes (Signed)
Pt c/o nasal drainage, sore throat, ear pain, cough x5days.  Pt was around her grandson who had nasal drainage after receiving immunizations at a [facility].  Pt states that she had a sore throat on Friday and on Saturday developed a cough with eye and ear pain.  Pt took a home covid test  on 10/27/21 and it was negative.   Pt asks for a covid and flu test.

## 2021-10-28 NOTE — ED Provider Notes (Signed)
[facility]    CSN: [id number] Arrival date & time: 10/28/21  0944      History   Chief Complaint Chief Complaint  Patient presents with   Nasal Congestion   Cough   Sore Throat    HPI Theresa Ryan is a 51 y.o. female presenting for 3-4 day history of illness. Reports cough, congestion, sore throat, ear pain. Denies fever, facial pain, body aches, shortness of breath, n/v/d. Reports that she was around her grandson who was recently ill with similar symptoms and then her mother became ill. She says that she took a COVID test yesterday and it was negative. Requests to be tested again for COVID and also for influenza. Has been taking OTC meds (Claritin D) for symptoms. No other complaints.  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) 11/27/2016   STD exposure 11/26/2016   Pelvic pain 10/22/2016   History of cervical dysplasia 10/22/2016   History of D&C 10/22/2016   History of tubal ligation 10/22/2016   Tobacco user 10/22/2016    Past Surgical History:  Procedure Laterality Date   BACK SURGERY     CERVICAL BIOPSY  W/ LOOP ELECTRODE EXCISION     CESAREAN SECTION     CHOLECYSTECTOMY     CRYOTHERAPY     DILATION AND CURETTAGE OF UTERUS     LAPAROSCOPY     TUBAL LIGATION  2006    OB History     Gravida  6   Para  4   Term  2   Preterm  2   AB  2   Living  3       SAB      IAB  2   Ectopic      Multiple      Live Births  3            Home Medications    Prior to Admission medications   Medication Sig Start Date End Date Taking? Authorizing Provider  cholecalciferol (VITAMIN D3) 25 MCG (1000 UNIT) tablet Take 1,000 Units by mouth daily. Take 2 tablets once daily.   Yes [provider]  cyanocobalamin (,VITAMIN B-12,) 1000 MCG/ML injection Inject 1 mL (1,000 mcg total) into the muscle every 30 (thirty) days. 06/26/20  Yes Hildred Laser, MD  ELDERBERRY PO Take by mouth.   Yes [provider]  ipratropium (ATROVENT) 0.06 % nasal spray Place 2 sprays into both nostrils 4 (four) times daily. 10/28/21  Yes Shirlee Latch, PA-C  Multiple Vitamins-Minerals (MULTIVITAMIN WOMEN PO) Take by mouth. 2 gummies per day   Yes [provider]  promethazine-dextromethorphan (PROMETHAZINE-DM) 6.25-15 MG/5ML syrup Take 5 mLs by mouth 4 (four) times daily as needed. 10/28/21  Yes Eusebio Friendly B, PA-C  tobramycin-dexamethasone [facility]) ophthalmic solution Place 2 drops into the left  eye every 6 (six) hours. 08/02/21  Yes Becky Augusta, NP    Family History Family History  Problem Relation Age of Onset   Diabetes Mother    Hypertension Father    Prostate cancer Father    Colon cancer Maternal Grandmother    Diabetes Daughter    Breast cancer Neg Hx    Ovarian cancer Neg Hx     Social History Social History   Tobacco Use   Smoking status: Every Day    Packs/day: 0.50    Types: Cigarettes   Smokeless tobacco: Never   Tobacco comments:    Off and on since age 76  Vaping Use   Vaping Use: Never used  Substance Use Topics   Alcohol use: Not Currently    Comment: occas   Drug use: No     Allergies   Vicoprofen [hydrocodone-ibuprofen]   Review of Systems Review of Systems  Constitutional:  Negative for chills, diaphoresis, fatigue and fever.  HENT:  Positive for congestion, ear pain, rhinorrhea and sore  throat. Negative for sinus pressure and sinus pain.   Respiratory:  Positive for cough. Negative for shortness of breath.   Gastrointestinal:  Negative for abdominal pain, nausea and vomiting.  Musculoskeletal:  Negative for arthralgias and myalgias.  Skin:  Negative for rash.  Neurological:  Negative for weakness and headaches.  Hematological:  Negative for adenopathy.     Physical Exam Triage Vital Signs ED Triage Vitals  Enc Vitals Group     BP      Pulse      Resp      Temp      Temp src      SpO2      Weight      Height      Head Circumference      Peak Flow      Pain Score      Pain Loc      Pain Edu?      Excl. in GC?    No data found.  Updated Vital Signs BP 124/81 (BP Location: Left Arm)   Pulse 83   Temp 98.5 F (36.9 C) (Oral)   Resp 18   Ht 5\' 5"  (1.651 m)   Wt 165 lb (74.8 kg)   LMP 10/14/2021   SpO2 100%   BMI 27.46 kg/m      Physical Exam Vitals and nursing note reviewed.  Constitutional:      General: She is not in acute distress.    Appearance: Normal appearance. She is not ill-appearing or toxic-appearing.  HENT:     Head: Normocephalic and atraumatic.     Nose: Congestion present.     Mouth/Throat:     Mouth: Mucous membranes are moist.     Pharynx: Oropharynx is clear. Posterior oropharyngeal erythema present.  Eyes:     General: No scleral icterus.       Right eye: No discharge.        Left eye: No discharge.     Conjunctiva/sclera: Conjunctivae normal.  Cardiovascular:     Rate and Rhythm: Normal rate and regular rhythm.     Heart sounds: Normal heart sounds.  Pulmonary:     Effort: Pulmonary effort is normal. No respiratory distress.     Breath sounds: Normal breath sounds.  Musculoskeletal:     Cervical back: Neck supple.  Skin:    General: Skin is dry.  Neurological:     General: No focal deficit present.  Mental Status: She is alert. Mental status is at baseline.     Motor: No weakness.     Gait: Gait normal.   Psychiatric:        Mood and Affect: Mood normal.        Behavior: Behavior normal.        Thought Content: Thought content normal.      UC Treatments / Results  Labs (all labs ordered are listed, but only abnormal results are displayed) Labs Reviewed  RESP PANEL BY RT-PCR (FLU A&B, COVID) [id number]    EKG   Radiology No results found.  Procedures Procedures (including critical care time)  Medications Ordered in UC Medications - No data to display  Initial Impression / Assessment and Plan / UC Course  I have reviewed the triage vital signs and the nursing notes.  Pertinent labs & imaging results that were available during my care of the patient were reviewed by me and considered in my medical decision making (see chart for details).   51 y/o female presents for 3-4 day history of cough, congestion, sore throat and ear pain. Does not report fever, SOB, n/v/d. Negative at home COVID test yesterday.   Vitals all normal and stable. Overall well appearing. On exam, she has nasal congestion and erythema of posterior pharynx. Chest is CTA and heart RRR.   Respiratory panel obtained.  All negative.  Advised patient I will call her if test results are positive so no need to call.  Advised if she did not hear from [place], she likely has another virus.  Advised supportive care. Advised decongestants, rest and fluids. Sent promethazine DM and Atrovent nasal spray. Reviewed that she should be feeling better within 7-10 days. Advised to follow up as needed if not feeling better or for worsening symptoms.    Final Clinical Impressions(s) / UC Diagnoses   Final diagnoses:  Viral upper respiratory tract infection  Acute cough  Sore throat  Encounter for screening for COVID-19     Discharge Instructions      -We will call if your COVID or flu test are positive.  URI/COLD SYMPTOMS: Your exam today is consistent with a viral illness. Antibiotics are not indicated at this time. Use  medications as directed, including cough syrup, nasal saline, and decongestants. Your symptoms should improve over the next few days and resolve within 7-10 days. Increase rest and fluids. F/u if symptoms worsen or predominate such as sore throat, ear pain, productive cough, shortness of breath, or if you develop high fevers or worsening fatigue over the next several days.       ED Prescriptions     Medication Sig Dispense Auth. Provider   promethazine-dextromethorphan (PROMETHAZINE-DM) 6.25-15 MG/5ML syrup Take 5 mLs by mouth 4 (four) times daily as needed. 118 mL Eusebio Friendly B, PA-C   ipratropium (ATROVENT) 0.06 % nasal spray Place 2 sprays into both nostrils 4 (four) times daily. 15 mL Shirlee Latch, PA-C      PDMP not reviewed this encounter.   Shirlee Latch, PA-C 10/28/21 1138

## 2021-12-02 ENCOUNTER — Ambulatory Visit
Admission: RE | Admit: 2021-12-02 | Discharge: 2021-12-02 | Disposition: A | Discharge status: Home / Self Care | Payer: Medicaid Other | Source: Ambulatory Visit | Attending: Obstetrics and Gynecology | Admitting: Obstetrics and Gynecology

## 2021-12-02 DIAGNOSIS — Z1231 Encounter for screening mammogram for malignant neoplasm of breast: Secondary | ICD-10-CM | POA: Diagnosis present

## 2022-06-25 NOTE — Progress Notes (Deleted)
PCP: Patient, No Pcp Per   No chief complaint on file.   HPI:      [name] is a 52 y.o. [id number] whose LMP was No LMP recorded., presents today for her annual examination.  Her menses are {norm/abn:715}, lasting {number: 22536} days.  Dysmenorrhea {dysmen:716}. She {does:18564} have intermenstrual bleeding. She {does:18564} have vasomotor sx.   Sex activity: {sex active: 315163}. She {does:18564} have vaginal dryness.  Last Pap: 12/30/18 Results were: no abnormalities /neg HPV DNA.  Hx of STDs: {STD hx:14358}  Last mammogram: 12/02/21 Results were: normal--routine follow-up in 12 months There is no FH of breast cancer. There is no FH of ovarian cancer. The patient {does:18564} do self-breast exams.  Colonoscopy: has colooscopy scheduled for 7/24  Tobacco use: {tob:20664} Alcohol use: {Alcohol:11675} No drug use Exercise: {exercise:31265}  She {does:18564} get adequate calcium and Vitamin D in her diet.  Labs with PCP.   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) 11/27/2016   STD exposure 11/26/2016   Pelvic pain 10/22/2016   History of cervical dysplasia 10/22/2016   History of D&C 10/22/2016   History of tubal ligation 10/22/2016   Tobacco user 10/22/2016    Past Surgical History:  Procedure Laterality Date   BACK SURGERY     CERVICAL BIOPSY  W/ LOOP ELECTRODE EXCISION     CESAREAN SECTION     CHOLECYSTECTOMY     CRYOTHERAPY     DILATION AND CURETTAGE OF UTERUS     LAPAROSCOPY     TUBAL LIGATION  2006    Family History  Problem Relation Age of Onset   Diabetes Mother    Hypertension Father    Prostate cancer Father    Colon cancer Maternal Grandmother    Diabetes Daughter    Breast cancer Neg Hx    Ovarian cancer Neg Hx     Social History   Socioeconomic History   Marital status: Single    Spouse name: Not  on file   Number of children: Not on file   Years of education: Not on file   Highest education level: Not on file  Occupational History   Not on file  Tobacco Use   Smoking status: Every Day    Packs/day: .5    Types: Cigarettes   Smokeless tobacco: Never   Tobacco comments:    Off and on since age 12  Vaping Use   Vaping Use: Never used  Substance and Sexual Activity   Alcohol use: Not Currently    Comment: occas   Drug use: No   Sexual activity: Yes    Birth control/protection: Surgical, Condom  Other Topics Concern   Not on file  Social History Narrative   Not on file   Social Determinants of Health   Financial Resource Strain: Not on file  Food Insecurity: Not on file  Transportation Needs: Not on file  Physical Activity: Insufficiently Active (12/23/2017)   Exercise Vital Sign    Days of Exercise per Week: 5 days    Minutes of Exercise per Session: 20 min  Stress: Not on file  Social Connections: Not on file  Intimate Partner Violence: Not on file     Current Outpatient Medications:    cholecalciferol (VITAMIN D3) 25 MCG (1000 UNIT) tablet, Take 1,000 Units by mouth daily. Take 2 tablets once daily., Disp: ,  Rfl:    cyanocobalamin (,VITAMIN B-12,) 1000 MCG/ML injection, Inject 1 mL (1,000 mcg total) into the muscle every 30 (thirty) days., Disp: 1 mL, Rfl: 1   ELDERBERRY PO, Take by mouth., Disp: , Rfl:    ipratropium (ATROVENT) 0.06 % nasal spray, Place 2 sprays into both nostrils 4 (four) times daily., Disp: 15 mL, Rfl: 0   Multiple Vitamins-Minerals (MULTIVITAMIN WOMEN PO), Take by mouth. 2 gummies per day, Disp: , Rfl:    promethazine-dextromethorphan (PROMETHAZINE-DM) 6.25-15 MG/5ML syrup, Take 5 mLs by mouth 4 (four) times daily as needed., Disp: 118 mL, Rfl: 0   tobramycin-dexamethasone (TOBRADEX) ophthalmic solution, Place 2 drops into the left eye every 6 (six) hours., Disp: 5 mL, Rfl: 0     ROS:  Review of Systems BREAST: No  symptoms    Objective: There were no vitals taken for this visit.   OBGyn Exam  Results: No results found for this or any previous visit (from the past 24 hour(s)).  Assessment/Plan:  No diagnosis found.   No orders of the defined types were placed in this encounter.           GYN counsel {counseling: 16159}    F/U  No follow-ups on file.  Jaislyn Blinn B. Milinda Sweeney, PA-C 06/25/2022 3:34 PM

## 2022-06-26 ENCOUNTER — Ambulatory Visit: Payer: Medicaid Other | Admitting: Obstetrics and Gynecology

## 2022-08-06 NOTE — Progress Notes (Signed)
GYNECOLOGY ANNUAL PHYSICAL EXAM PROGRESS NOTE  Subjective:    Theresa Ryan is a 52 y.o. [id number] female who presents for an annual exam. The patient has no complaints today. The patient is sexually active. The patient participates in regular exercise: yes. Has the patient ever been transfused or tattooed?: yes. The patient reports that there is not domestic violence in her life.   Patient reports that she was seen by a new PCP to establish care [facility]), and was told that she should be getting more frequent pap smears based on her history, and maybe should consider hysterectomy.   Theresa Ryan also desires to have STI screening today. Notes that she has become involved with a previous partner.  He recently had a check up and was tested for HIV and other serology which was negative, however was not tested for genital infections.  She has no current symptoms or concerns, but notes that she does have a history of trichomonas in the past, and at that time was also involved with this person. Would like genital STI screening.   Menstrual History: Menarche age: 85 Patient's last menstrual period was 08/02/2022 (exact date).     Gynecologic History:  Contraception: condoms and tubal ligation History of STI's: Trichomonas infection in 2019, treated. H/o HSV.  Last Pap: 12/30/2018. Results were: normal. Notes h/o abnormal pap smear x 1, NILM with HR HPV+ in 2018. Also remote h/o in 1995 of abnormal pap. H/o LEEP.  Last mammogram: 12/02/2021. Results were: normal Colonoscopy: Scheduled in July     OB History  Gravida Para Term Preterm AB Living  6 4 2 2 2 3   SAB IAB Ectopic Multiple Live Births  0 2 0 0 3    # Outcome Date GA Lbr Len/2nd Weight Sex Delivery Anes PTL Lv  6 Preterm 2006        FD  5 Preterm 2000     CS-LTranv   LIV     Complications: Abruptio Placenta  4 IAB 1998          3 Term 1995    F Vag-Spont   LIV  2 IAB 1991          1 Term 62    F Vag-Spont   LIV    Past  Medical History:  Diagnosis Date   Abnormal Pap smear of cervix    DDD (degenerative disc disease), cervical    Family history of adverse reaction to anesthesia    Mother - "flat lined during procedure  and had to be brought back"   years ago   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   Migraine headache    "haven't been as bad recently"   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    Past Surgical History:  Procedure Laterality Date   BACK SURGERY     CERVICAL BIOPSY  W/ LOOP ELECTRODE EXCISION     CESAREAN SECTION     CHOLECYSTECTOMY     CRYOTHERAPY     DILATION AND CURETTAGE OF UTERUS     LAPAROSCOPY     TUBAL LIGATION  2006    Family History  Problem Relation Age of Onset   Diabetes Mother    Hypertension Father    Prostate cancer Father    Colon cancer Maternal Grandmother    Diabetes Daughter    Breast cancer Neg Hx    Ovarian cancer Neg Hx  Social History   Socioeconomic History   Marital status: Single    Spouse name: Not on file   Number of children: Not on file   Years of education: Not on file   Highest education level: Not on file  Occupational History   Not on file  Tobacco Use   Smoking status: Every Day    Packs/day: .5    Types: Cigarettes   Smokeless tobacco: Never   Tobacco comments:    Off and on since age 33  Vaping Use   Vaping Use: Never used  Substance and Sexual Activity   Alcohol use: Not Currently    Comment: occas   Drug use: No   Sexual activity: Yes    Birth control/protection: Surgical, Condom  Other Topics Concern   Not on file  Social History Narrative   Not on file   Social Determinants of Health   Financial Resource Strain: Not on file  Food Insecurity: Not on file  Transportation Needs: Not on file  Physical Activity: Insufficiently Active (12/23/2017)   Exercise Vital Sign    Days of Exercise per Week: 5 days    Minutes of Exercise per Session: 20  min  Stress: Not on file  Social Connections: Not on file  Intimate Partner Violence: Not on file    Current Outpatient Medications on File Prior to Visit  Medication Sig Dispense Refill   cholecalciferol (VITAMIN D3) 25 MCG (1000 UNIT) tablet Take 1,000 Units by mouth daily. Take 2 tablets once daily.     cyanocobalamin (,VITAMIN B-12,) 1000 MCG/ML injection Inject 1 mL (1,000 mcg total) into the muscle every 30 (thirty) days. 1 mL 1   ELDERBERRY PO Take by mouth.     ipratropium (ATROVENT) 0.06 % nasal spray Place 2 sprays into both nostrils 4 (four) times daily. 15 mL 0   Multiple Vitamins-Minerals (MULTIVITAMIN WOMEN PO) Take by mouth. 2 gummies per day     promethazine-dextromethorphan (PROMETHAZINE-DM) 6.25-15 MG/5ML syrup Take 5 mLs by mouth 4 (four) times daily as needed. 118 mL 0   tobramycin-dexamethasone (TOBRADEX) ophthalmic solution Place 2 drops into the left eye every 6 (six) hours. 5 mL 0   No current facility-administered medications on file prior to visit.    Allergies  Allergen Reactions   Vicoprofen [Hydrocodone-Ibuprofen] Other (See Comments)    Chest pains     Review of Systems Constitutional: negative for chills, fatigue, fevers and sweats Eyes: negative for irritation, redness and visual disturbance Ears, nose, mouth, throat, and face: negative for hearing loss, nasal congestion, snoring and tinnitus Respiratory: negative for asthma, cough, sputum Cardiovascular: negative for chest pain, dyspnea, exertional chest pressure/discomfort, irregular heart beat, palpitations and syncope Gastrointestinal: negative for abdominal pain, change in bowel habits, nausea and vomiting Genitourinary: negative for abnormal menstrual periods, genital lesions, sexual problems and vaginal discharge, dysuria and urinary incontinence Integument/breast: negative for breast lump, breast tenderness and nipple discharge Hematologic/lymphatic: negative for bleeding and easy  bruising Musculoskeletal:negative for back pain and muscle weakness Neurological: negative for dizziness, headaches, vertigo and weakness Endocrine: negative for diabetic symptoms including polydipsia, polyuria and skin dryness Allergic/Immunologic: negative for hay fever and urticaria      Objective:  Blood pressure 126/88, pulse 88, resp. rate 16, height 5\' 5"  (1.651 m), weight 154 lb 14.4 oz (70.3 kg), last menstrual period 08/02/2022.  Body mass index is 25.78 kg/m.    General Appearance:    Alert, cooperative, no distress, appears stated age  Head:  Normocephalic, without obvious abnormality, atraumatic  Eyes:    PERRL, conjunctiva/corneas clear, EOM's intact, both eyes  Ears:    Normal external ear canals, both ears  Nose:   Nares normal, septum midline, mucosa normal, no drainage or sinus tenderness  Throat:   Lips, mucosa, and tongue normal; teeth and gums normal  Neck:   Supple, symmetrical, trachea midline, no adenopathy; thyroid: no enlargement/tenderness/nodules; no carotid bruit or JVD  Back:     Symmetric, no curvature, ROM normal, no CVA tenderness  Lungs:     Clear to auscultation bilaterally, respirations unlabored  Chest Wall:    No tenderness or deformity   Heart:    Regular rate and rhythm, S1 and S2 normal, no murmur, rub or gallop  Breast Exam:    No tenderness, masses, or nipple abnormality  Abdomen:     Soft, non-tender, bowel sounds active all four quadrants, no masses, no organomegaly.    Genitalia:    Pelvic:external genitalia normal, vagina without lesions, discharge, or tenderness, rectovaginal septum  normal. Cervix normal in appearance, no cervical motion tenderness, no adnexal masses or tenderness.  Uterus normal size, shape, mobile, regular contours, nontender.  Rectal:    Normal external sphincter.  No hemorrhoids appreciated. Internal exam not done.   Extremities:   Extremities normal, atraumatic, no cyanosis or edema  Pulses:   2+ and symmetric all  extremities  Skin:   Skin color, texture, turgor normal, no rashes or lesions  Lymph nodes:   Cervical, supraclavicular, and axillary nodes normal  Neurologic:   CNII-XII intact, normal strength, sensation and reflexes throughout   .  Labs:  Lab Results  Component Value Date   WBC 8.6 01/01/2021   HGB 13.2 01/01/2021   HCT 38.9 01/01/2021   MCV 85 01/01/2021   PLT 242 01/01/2021    Lab Results  Component Value Date   CREATININE 0.95 01/01/2021   BUN 8 01/01/2021   NA 140 01/01/2021   K 3.9 01/01/2021   CL 104 01/01/2021   CO2 22 01/01/2021    Lab Results  Component Value Date   ALT 11 01/01/2021   AST 21 01/01/2021   ALKPHOS 60 01/01/2021   BILITOT 0.4 01/01/2021    Lab Results  Component Value Date   TSH 0.736 01/01/2021     Assessment:   1. Encounter for well woman exam with routine gynecological exam   2. Breast cancer screening by mammogram   3. Screen for STD (sexually transmitted disease)   4. Cervical cancer screening      Plan:  - Blood tests: UTD by PCP. - Breast self exam technique reviewed and patient encouraged to perform self-exam monthly. - Contraception: condoms and tubal ligation. - Discussed healthy lifestyle modifications. - Mammogram ordered - Pap smear ordered (although not due until November of this year).  Discussed pap history in detail, along with ASCCP Guidelines. Advised that although she did have a higher grade of dsyplasia in the past, this was treated with a LEEP, and has only had 1 other very mildly abnormal pap smear since then.  Can continue routine screens q 3 years.  - Vaginal STD screening performed at patient's request.   Follow up in 1 year for annual exam   Hildred Laser, MD [facility]

## 2022-08-06 NOTE — Patient Instructions (Signed)
Preventive Care 40-50 Years Old, Female Preventive care refers to lifestyle choices and visits with your health care provider that can promote health and wellness. Preventive care visits are also called wellness exams. What can I expect for my preventive care visit? Counseling Your health care provider may ask you questions about your: Medical history, including: Past medical problems. Family medical history. Pregnancy history. Current health, including: Menstrual cycle. Method of birth control. Emotional well-being. Home life and relationship well-being. Sexual activity and sexual health. Lifestyle, including: Alcohol, nicotine or tobacco, and drug use. Access to firearms. Diet, exercise, and sleep habits. Work and work environment. Sunscreen use. Safety issues such as seatbelt and bike helmet use. Physical exam Your health care provider will check your: Height and weight. These may be used to calculate your BMI (body mass index). BMI is a measurement that tells if you are at a healthy weight. Waist circumference. This measures the distance around your waistline. This measurement also tells if you are at a healthy weight and may help predict your risk of certain diseases, such as type 2 diabetes and high blood pressure. Heart rate and blood pressure. Body temperature. Skin for abnormal spots. What immunizations do I need?  Vaccines are usually given at various ages, according to a schedule. Your health care provider will recommend vaccines for you based on your age, medical history, and lifestyle or other factors, such as travel or where you work. What tests do I need? Screening Your health care provider may recommend screening tests for certain conditions. This may include: Lipid and cholesterol levels. Diabetes screening. This is done by checking your blood sugar (glucose) after you have not eaten for a while (fasting). Pelvic exam and Pap test. Hepatitis B test. Hepatitis C  test. HIV (human immunodeficiency virus) test. STI (sexually transmitted infection) testing, if you are at risk. Lung cancer screening. Colorectal cancer screening. Mammogram. Talk with your health care provider about when you should start having regular mammograms. This may depend on whether you have a family history of breast cancer. BRCA-related cancer screening. This may be done if you have a family history of breast, ovarian, tubal, or peritoneal cancers. Bone density scan. This is done to screen for osteoporosis. Talk with your health care provider about your test results, treatment options, and if necessary, the need for more tests. Follow these instructions at home: Eating and drinking  Eat a diet that includes fresh fruits and vegetables, whole grains, lean protein, and low-fat dairy products. Take vitamin and mineral supplements as recommended by your health care provider. Do not drink alcohol if: Your health care provider tells you not to drink. You are pregnant, may be pregnant, or are planning to become pregnant. If you drink alcohol: Limit how much you have to 0-1 drink a day. Know how much alcohol is in your drink. In the [place] one drink equals one 12 oz bottle of beer (355 mL), one 5 oz glass of wine (148 mL), or one 1 oz glass of hard liquor (44 mL). Lifestyle Brush your teeth every morning and night with fluoride toothpaste. Floss one time each day. Exercise for at least 30 minutes 5 or more days each week. Do not use any products that contain nicotine or tobacco. These products include cigarettes, chewing tobacco, and vaping devices, such as e-cigarettes. If you need help quitting, ask your health care provider. Do not use drugs. If you are sexually active, practice safe sex. Use a condom or other form of protection to   prevent STIs. If you do not wish to become pregnant, use a form of birth control. If you plan to become pregnant, see your health care provider for a  prepregnancy visit. Take aspirin only as told by your health care provider. Make sure that you understand how much to take and what form to take. Work with your health care provider to find out whether it is safe and beneficial for you to take aspirin daily. Find healthy ways to manage stress, such as: Meditation, yoga, or listening to music. Journaling. Talking to a trusted person. Spending time with friends and family. Minimize exposure to UV radiation to reduce your risk of skin cancer. Safety Always wear your seat belt while driving or riding in a vehicle. Do not drive: If you have been drinking alcohol. Do not ride with someone who has been drinking. When you are tired or distracted. While texting. If you have been using any mind-altering substances or drugs. Wear a helmet and other protective equipment during sports activities. If you have firearms in your house, make sure you follow all gun safety procedures. Seek help if you have been physically or sexually abused. What's next? Visit your health care provider once a year for an annual wellness visit. Ask your health care provider how often you should have your eyes and teeth checked. Stay up to date on all vaccines. This information is not intended to replace advice given to you by your health care provider. Make sure you discuss any questions you have with your health care provider. Document Revised: 08/01/2020 Document Reviewed: 08/01/2020 Elsevier Patient Education  2024 Else[employer]. Breast Self-Awareness Breast self-awareness is knowing how your breasts look and feel. You need to: Check your breasts on a regular basis. Tell your doctor about any changes. Become familiar with the look and feel of your breasts. This can help you catch a breast problem while it is still small and can be treated. You should do breast self-exams even if you have breast implants. What you need: A mirror. A well-lit room. A pillow or other  soft object. How to do a breast self-exam Follow these steps to do a breast self-exam: Look for changes  Take off all the clothes above your waist. Stand in front of a mirror in a room with good lighting. Put your hands down at your sides. Compare your breasts in the mirror. Look for any difference between them, such as: A difference in shape. A difference in size. Wrinkles, dips, and bumps in one breast and not the other. Look at each breast for changes in the skin, such as: Redness. Scaly areas. Skin that has gotten thicker. Dimpling. Open sores (ulcers). Look for changes in your nipples, such as: Fluid coming out of a nipple. Fluid around a nipple. Bleeding. Dimpling. Redness. A nipple that looks pushed in (retracted), or that has changed position. Feel for changes Lie on your back. Feel each breast. To do this: Pick a breast to feel. Place a pillow under the shoulder closest to that breast. Put the arm closest to that breast behind your head. Feel the nipple area of that breast using the hand of your other arm. Feel the area with the pads of your three middle fingers by making small circles with your fingers. Use light, medium, and firm pressure. Continue the overlapping circles, moving downward over the breast. Keep making circles with your fingers. Stop when you feel your ribs. Start making circles with your fingers again, this time going   upward until you reach your collarbone. Then, make circles outward across your breast and into your armpit area. Squeeze your nipple. Check for discharge and lumps. Repeat these steps to check your other breast. Sit or stand in the tub or shower. With soapy water on your skin, feel each breast the same way you did when you were lying down. Write down what you find Writing down what you find can help you remember what to tell your doctor. Write down: What is normal for each breast. Any changes you find in each breast. These  include: The kind of changes you find. A tender or painful breast. Any lump you find. Write down its size and where it is. When you last had your monthly period (menstrual cycle). General tips If you are breastfeeding, the best time to check your breasts is after you feed your baby or after you use a breast pump. If you get monthly bleeding, the best time to check your breasts is 5-7 days after your monthly cycle ends. With time, you will become comfortable with the self-exam. You will also start to know if there are changes in your breasts. Contact a doctor if: You see a change in the shape or size of your breasts or nipples. You see a change in the skin of your breast or nipples, such as red or scaly skin. You have fluid coming from your nipples that is not normal. You find a new lump or thick area. You have breast pain. You have any concerns about your breast health. Summary Breast self-awareness includes looking for changes in your breasts and feeling for changes within your breasts. You should do breast self-awareness in front of a mirror in a well-lit room. If you get monthly periods (menstrual cycles), the best time to check your breasts is 5-7 days after your period ends. Tell your doctor about any changes you see in your breasts. Changes include changes in size, changes on the skin, painful or tender breasts, or fluid from your nipples that is not normal. This information is not intended to replace advice given to you by your health care provider. Make sure you discuss any questions you have with your health care provider. Document Revised: 07/11/2021 Document Reviewed: 12/06/2020 Elsevier Patient Education  2024 Elsevier Inc.  

## 2022-08-08 ENCOUNTER — Ambulatory Visit (INDEPENDENT_AMBULATORY_CARE_PROVIDER_SITE_OTHER): Payer: Medicaid Other | Admitting: Obstetrics and Gynecology

## 2022-08-08 ENCOUNTER — Encounter: Payer: Self-pay | Admitting: Obstetrics and Gynecology

## 2022-08-08 ENCOUNTER — Other Ambulatory Visit (HOSPITAL_COMMUNITY)
Admission: RE | Admit: 2022-08-08 | Discharge: 2022-08-08 | Disposition: A | Discharge status: Home / Self Care | Payer: Medicaid Other | Source: Ambulatory Visit | Attending: Obstetrics and Gynecology | Admitting: Obstetrics and Gynecology

## 2022-08-08 VITALS — BP 126/88 | HR 88 | Resp 16 | Ht 65.0 in | Wt 154.9 lb

## 2022-08-08 DIAGNOSIS — Z113 Encounter for screening for infections with a predominantly sexual mode of transmission: Secondary | ICD-10-CM | POA: Diagnosis present

## 2022-08-08 DIAGNOSIS — Z1231 Encounter for screening mammogram for malignant neoplasm of breast: Secondary | ICD-10-CM

## 2022-08-08 DIAGNOSIS — Z01419 Encounter for gynecological examination (general) (routine) without abnormal findings: Secondary | ICD-10-CM | POA: Insufficient documentation

## 2022-08-08 DIAGNOSIS — Z124 Encounter for screening for malignant neoplasm of cervix: Secondary | ICD-10-CM | POA: Diagnosis present

## 2022-08-08 DIAGNOSIS — Z131 Encounter for screening for diabetes mellitus: Secondary | ICD-10-CM

## 2022-08-08 DIAGNOSIS — Z13220 Encounter for screening for lipoid disorders: Secondary | ICD-10-CM

## 2022-08-12 LAB — CYTOLOGY - PAP
Comment: NEGATIVE
Diagnosis: NEGATIVE
High risk HPV: NEGATIVE

## 2022-08-13 LAB — CERVICOVAGINAL ANCILLARY ONLY
Chlamydia: NEGATIVE
Comment: NEGATIVE
Comment: NEGATIVE
Comment: NORMAL
Neisseria Gonorrhea: NEGATIVE
Trichomonas: NEGATIVE

## 2023-04-29 ENCOUNTER — Ambulatory Visit
Admission: EM | Admit: 2023-04-29 | Discharge: 2023-04-29 | Disposition: A | Discharge status: Home / Self Care | Attending: Emergency Medicine | Admitting: Emergency Medicine

## 2023-04-29 DIAGNOSIS — R002 Palpitations: Secondary | ICD-10-CM | POA: Diagnosis not present

## 2023-04-29 DIAGNOSIS — F419 Anxiety disorder, unspecified: Secondary | ICD-10-CM

## 2023-04-29 DIAGNOSIS — R0602 Shortness of breath: Secondary | ICD-10-CM | POA: Diagnosis not present

## 2023-04-29 DIAGNOSIS — R0789 Other chest pain: Secondary | ICD-10-CM

## 2023-04-29 LAB — GLUCOSE, CAPILLARY: Glucose-Capillary: 153 mg/dL — ABNORMAL HIGH (ref 70–99)

## 2023-04-29 MED ORDER — ASPIRIN 81 MG PO CHEW: 324.0000 mg | CHEWABLE_TABLET | Freq: Once | ORAL | Status: DC

## 2023-04-29 NOTE — ED Triage Notes (Signed)
 3/8 pt states she felt lightheaded, dizzy states her heart was racing, then on 3/9 she started having back pain/cramping and still having SOB, lightheaded, dizziness, cough, congestion  x 4 days. Today having chest tightness/ heaviness with heart racing.   Pt drove here herself but does have family on the way here.

## 2023-04-29 NOTE — ED Notes (Signed)
 Patient is being discharged from the Urgent Care and sent to the Emergency Department via [facility] . Per provider Para March D., patient is in need of higher level of care due to SOB with chest pain needing further testing. Patient is aware and verbalizes understanding of plan of care. Pt states she is going to [facility] hospital.  Vitals:   04/29/23 1043  BP: (!) 155/90  Pulse: (!) 115  Resp: 18  Temp: 97.9 F (36.6 C)  SpO2: 98%

## 2023-04-29 NOTE — Discharge Instructions (Addendum)
 Go to ER now for further evaluation,so not eat or drink anything

## 2023-04-29 NOTE — ED Provider Notes (Signed)
 [facility]    CSN: [id number] Arrival date & time: 04/29/23  1023      History   Chief Complaint Chief Complaint  Patient presents with   Shortness of Breath   Palpitations    HPI Theresa Ryan is a 53 y.o. female.   53 year old female, Theresa Ryan, presents to urgent care for evaluation of intermittent shortness of breath, lightheadedness, dizzy, palpitations,tingling in hands,face since 04/25/2023.  Patient states she is having chest tightness with heaviness and heart racing at times today.  Pt appears anxious, labile mood, rapid speech. Patient drove herself here, but has family coming to be with her.   Patient endorses smoking half pack a day, drinking occasionally, occasional marijuana use.   PMH: anxiety(does not take anxiety meds),smoker  The history is provided by the patient. No language interpreter was used.    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) 11/27/2016   STD exposure 11/26/2016   Pelvic pain 10/22/2016   History of cervical dysplasia 10/22/2016   History of D&C 10/22/2016   History of tubal ligation 10/22/2016   Tobacco user 10/22/2016    Past Surgical History:  Procedure Laterality Date   BACK SURGERY     CERVICAL BIOPSY  W/ LOOP ELECTRODE EXCISION     CESAREAN SECTION      CHOLECYSTECTOMY     CRYOTHERAPY     DILATION AND CURETTAGE OF UTERUS     LAPAROSCOPY     TUBAL LIGATION  2006    OB History     Gravida  6   Para  4   Term  2   Preterm  2   AB  2   Living  3      SAB      IAB  2   Ectopic      Multiple      Live Births  3            Home Medications    Prior to Admission medications   Medication Sig Start Date End Date Taking? Authorizing Provider  cholecalciferol (VITAMIN D3) 25 MCG (1000 UNIT) tablet Take 1,000 Units by mouth daily. Take 2 tablets once daily. Patient not taking: Reported on 08/08/2022    [provider]  cyanocobalamin (,VITAMIN B-12,) 1000 MCG/ML injection Inject 1 mL (1,000 mcg total) into the muscle every 30 (thirty) days. Patient not taking: Reported on 08/08/2022 06/26/20   Hildred Laser, MD  ELDERBERRY PO Take by mouth. Patient not taking: Reported on 08/08/2022    [provider]  ipratropium (ATROVENT) 0.06 % nasal spray Place 2 sprays into both nostrils 4 (four) times daily. Patient not taking: Reported on 08/08/2022 10/28/21   Shirlee Latch, PA-C  Multiple Vitamins-Minerals (MULTIVITAMIN WOMEN  PO) Take by mouth. 2 gummies per day    [provider]  promethazine-dextromethorphan (PROMETHAZINE-DM) 6.25-15 MG/5ML syrup Take 5 mLs by mouth 4 (four) times daily as needed. Patient not taking: Reported on 08/08/2022 10/28/21   Eusebio Friendly B, PA-C  tobramycin-dexamethasone [facility]) ophthalmic solution Place 2 drops into the left eye every 6 (six) hours. Patient not taking: Reported on 08/08/2022 08/02/21   Becky Augusta, NP    Family History Family History  Problem Relation Age of Onset   Diabetes Mother    Hypertension Father    Prostate cancer Father    Colon cancer Maternal Grandmother    Diabetes Daughter    Breast cancer Neg Hx    Ovarian cancer Neg Hx     Social History Social History   Tobacco Use   Smoking status: Every Day    Current packs/day: 0.50     Types: Cigarettes   Smokeless tobacco: Never   Tobacco comments:    Off and on since age 59  Vaping Use   Vaping status: Never Used  Substance Use Topics   Alcohol use: Not Currently    Comment: occas   Drug use: Yes    Types: Marijuana    Comment: occ     Allergies   Vicoprofen [hydrocodone-ibuprofen]   Review of Systems Review of Systems  Constitutional:  Negative for fever.  HENT:  Positive for congestion.   Respiratory:  Positive for cough and shortness of breath.   Cardiovascular:  Positive for chest pain and palpitations.  Musculoskeletal:  Positive for back pain and myalgias.  Neurological:  Positive for dizziness and light-headedness.  Psychiatric/Behavioral:  Positive for agitation. The patient is nervous/anxious.   All other systems reviewed and are negative.    Physical Exam Triage Vital Signs ED Triage Vitals [04/29/23 1043]  Encounter Vitals Group     BP (!) 155/90     Systolic BP Percentile      Diastolic BP Percentile      Pulse Rate (!) 115     Resp 18     Temp 97.9 F (36.6 C)     Temp Source Oral     SpO2 98 %     Weight      Height      Head Circumference      Peak Flow      Pain Score      Pain Loc      Pain Education      Exclude from Growth Chart    No data found.  Updated Vital Signs BP (!) 155/90 (BP Location: Right Arm)   Pulse (!) 115   Temp 97.9 F (36.6 C) (Oral)   Resp 18   SpO2 98%   Visual Acuity Right Eye Distance:   Left Eye Distance:   Bilateral Distance:    Right Eye Near:   Left Eye Near:    Bilateral Near:     Physical Exam Vitals and nursing note reviewed.  Constitutional:      General: She is not in acute distress.    Appearance: She is well-developed.  HENT:     Head: Normocephalic and atraumatic.  Eyes:     Conjunctiva/sclera: Conjunctivae normal.  Cardiovascular:     Rate and Rhythm: Regular rhythm. Tachycardia present.     Heart sounds: Normal heart sounds. No murmur heard. Pulmonary:      Effort: Pulmonary effort is normal. No respiratory distress.     Breath sounds: Normal breath sounds  and air entry.  Abdominal:     Palpations: Abdomen is soft.     Tenderness: There is no abdominal tenderness.  Musculoskeletal:        General: No swelling.     Cervical back: Neck supple.  Skin:    General: Skin is warm and dry.     Capillary Refill: Capillary refill takes less than 2 seconds.  Neurological:     General: No focal deficit present.     Mental Status: She is alert and oriented to person, place, and time.     GCS: GCS eye subscore is 4. GCS verbal subscore is 5. GCS motor subscore is 6.     Motor: Motor function is intact.     Coordination: Coordination is intact.     Gait: Gait is intact.     Comments: Pt had normal gait while carrying cup of coffee in lobby during/after registration  Psychiatric:        Attention and Perception: Attention normal.        Mood and Affect: Mood is anxious.        Speech: Speech is rapid and pressured.        Behavior: Behavior is agitated.     Comments: Pt hyperventilating at times      UC Treatments / Results  Labs (all labs ordered are listed, but only abnormal results are displayed) Labs Reviewed  GLUCOSE, CAPILLARY - Abnormal; Notable for the following components:      Result Value   Glucose-Capillary 153 (*)    All other components within normal limits  CBG MONITORING, ED    EKG   Radiology No results found.  Procedures Procedures (including critical care time)  Medications Ordered in UC Medications  aspirin chewable tablet 324 mg (324 mg Oral Patient Refused/Not Given 04/29/23 1110)    Initial Impression / Assessment and Plan / UC Course  I have reviewed the triage vital signs and the nursing notes.  Pertinent labs & imaging results that were available during my care of the patient were reviewed by me and considered in my medical decision making (see chart for details).  Clinical Course as of 04/29/23 1132   Wed Apr 29, 2023  1053 Pt drinking coffee upon entry to exam room.  Advised to refrain from consuming coffee, if feeling anxious with CP/palpitations, will obtain CBG and EKG [JD]  1107 Patient and female visitor on cell phone during discussion, per patient wishes to remain on the phone as they are on the phone with family to proceed with discussion of plan of care:  reviewed EKG with pt/family, shows EKG changes from 2006 with sinus tachycardia rate 111, QTC is 478, biphasic ST abnormality lateral leads offered, 324 mg ASA po and EMS transport,pt refused both as she does not want to go to [facility] and wants to go to [facility] ER. Charge RN,Jennifer present during discussion, pt refuses ASA as "unsure if allergic".  Daughter is present and family wishes to drive patient to [facility] ER for further evaluation. Pt is anxious with rapid pressured speech. [JD]    Clinical Course User Index [JD] Robertine Kipper, Para March, NP    Ddx: Atypical chest pain, SOB,palpitations, anxiety Final Clinical Impressions(s) / UC Diagnoses   Final diagnoses:  SOB (shortness of breath)  Palpitations     Discharge Instructions      Go to ER now for further evaluation,so not eat or drink anything    ED Prescriptions   None  PDMP not reviewed this encounter.   Clancy Gourd, NP 04/29/23 1133

## 2023-07-10 ENCOUNTER — Ambulatory Visit
Admission: EM | Admit: 2023-07-10 | Discharge: 2023-07-10 | Disposition: A | Discharge status: Home / Self Care | Attending: Physician Assistant | Admitting: Physician Assistant

## 2023-07-10 DIAGNOSIS — L259 Unspecified contact dermatitis, unspecified cause: Secondary | ICD-10-CM | POA: Diagnosis not present

## 2023-07-10 MED ORDER — TRIAMCINOLONE ACETONIDE 0.5 % EX OINT: 1.0000 | TOPICAL_OINTMENT | Freq: Two times a day (BID) | CUTANEOUS | 0 refills | Status: DC

## 2023-07-10 NOTE — ED Provider Notes (Signed)
 [facility]    CSN: [id number] Arrival date & time: 07/10/23  1646      History   Chief Complaint Chief Complaint  Patient presents with   Rash    HPI Theresa Ryan is a 53 y.o. female presenting for pruritic rash of bilateral arms and torso for the past several days.  Rash began after mowing her lawn.  Denies any associated swelling or breathing problem.  Has been using Benadryl cream, and taking allergy medicine without relief.  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    Patient Active Problem List   Diagnosis Date Noted   SOB (shortness of breath) 04/29/2023   Palpitations 04/29/2023   Atypical chest pain 04/29/2023   Anxiety 04/29/2023   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) 11/27/2016   STD exposure 11/26/2016   Pelvic pain 10/22/2016   History of cervical dysplasia 10/22/2016   History of D&C 10/22/2016   History of tubal ligation 10/22/2016   Tobacco user 10/22/2016    Past Surgical History:  Procedure Laterality Date   BACK SURGERY     CERVICAL BIOPSY  W/ LOOP ELECTRODE EXCISION     CESAREAN SECTION     CHOLECYSTECTOMY     CRYOTHERAPY     DILATION AND CURETTAGE OF UTERUS     LAPAROSCOPY     TUBAL LIGATION  2006    OB History     Gravida  6   Para  4   Term  2   Preterm  2   AB  2   Living  3      SAB      IAB  2   Ectopic      Multiple      Live Births  3            Home Medications    Prior to Admission medications    Medication Sig Start Date End Date Taking? Authorizing Provider  triamcinolone  ointment (KENALOG ) 0.5 % Apply 1 Application topically 2 (two) times daily. 07/10/23  Yes Floydene Hy, PA-C  cholecalciferol (VITAMIN D3) 25 MCG (1000 UNIT) tablet Take 1,000 Units by mouth daily. Take 2 tablets once daily. Patient not taking: Reported on 08/08/2022    [provider]  cyanocobalamin  (,VITAMIN B-12,) 1000 MCG/ML injection Inject 1 mL (1,000 mcg total) into the muscle every 30 (thirty) days. Patient not taking: Reported on 08/08/2022 06/26/20   Teresa Fender, MD  ELDERBERRY PO Take by mouth. Patient not taking: Reported on 08/08/2022    [provider]  ipratropium (ATROVENT ) 0.06 % nasal spray Place 2 sprays into both nostrils 4 (four) times daily. Patient not taking: Reported on 08/08/2022 10/28/21   Floydene Hy, PA-C  Multiple Vitamins-Minerals (MULTIVITAMIN WOMEN PO) Take by mouth. 2 gummies per day    [provider]  promethazine -dextromethorphan (PROMETHAZINE -DM) 6.25-15 MG/5ML syrup Take 5 mLs by mouth 4 (four) times daily as  needed. Patient not taking: Reported on 08/08/2022 10/28/21   Nancy Axon B, PA-C  tobramycin -dexamethasone  (TOBRADEX ) ophthalmic solution Place 2 drops into the left eye every 6 (six) hours. Patient not taking: Reported on 08/08/2022 08/02/21   Kent Pear, NP    Family History Family History  Problem Relation Age of Onset   Diabetes Mother    Hypertension Father    Prostate cancer Father    Colon cancer Maternal Grandmother    Diabetes Daughter    Breast cancer Neg Hx    Ovarian cancer Neg Hx     Social History Social History   Tobacco Use   Smoking status: Every Day    Current packs/day: 0.50    Types: Cigarettes   Smokeless tobacco: Never   Tobacco comments:    Off and on since age 89  Vaping Use   Vaping status: Never Used  Substance Use Topics   Alcohol use: Not Currently    Comment: occas   Drug use: Yes     Types: Marijuana    Comment: occ     Allergies   Vicoprofen [hydrocodone-ibuprofen ]   Review of Systems Review of Systems  Musculoskeletal:  Negative for arthralgias and joint swelling.  Skin:  Positive for rash. Negative for wound.     Physical Exam Triage Vital Signs ED Triage Vitals  Encounter Vitals Group     BP 07/10/23 1701 (!) 139/98     Systolic BP Percentile --      Diastolic BP Percentile --      Pulse Rate 07/10/23 1701 98     Resp --      Temp 07/10/23 1701 98.9 F (37.2 C)     Temp Source 07/10/23 1701 Oral     SpO2 07/10/23 1701 100 %     Weight 07/10/23 1702 153 lb (69.4 kg)     Height 07/10/23 1702 5\' 5"  (1.651 m)     Head Circumference --      Peak Flow --      Pain Score 07/10/23 1702 0     Pain Loc --      Pain Education --      Exclude from Growth Chart --    No data found.  Updated Vital Signs BP (!) 139/98 (BP Location: Left Arm)   Pulse 98   Temp 98.9 F (37.2 C) (Oral)   Ht 5\' 5"  (1.651 m)   Wt 153 lb (69.4 kg)   LMP 02/18/2023   SpO2 100%   BMI 25.46 kg/m       Physical Exam Vitals and nursing note reviewed.  Constitutional:      General: She is not in acute distress.    Appearance: Normal appearance. She is not ill-appearing or toxic-appearing.  HENT:     Head: Normocephalic and atraumatic.  Eyes:     General: No scleral icterus.       Right eye: No discharge.        Left eye: No discharge.     Conjunctiva/sclera: Conjunctivae normal.  Cardiovascular:     Rate and Rhythm: Normal rate.  Pulmonary:     Effort: Pulmonary effort is normal. No respiratory distress.  Musculoskeletal:     Cervical back: Neck supple.  Skin:    General: Skin is dry.     Findings: Rash (erythematous vesicular adn papular rash of bilateral arms) present.  Neurological:     General: No focal deficit present.     Mental Status: She is alert.  Mental status is at baseline.     Motor: No weakness.     Gait: Gait normal.  Psychiatric:         Mood and Affect: Mood normal.        Behavior: Behavior normal.      UC Treatments / Results  Labs (all labs ordered are listed, but only abnormal results are displayed) Labs Reviewed - No data to display  EKG   Radiology No results found.  Procedures Procedures (including critical care time)  Medications Ordered in UC Medications - No data to display  Initial Impression / Assessment and Plan / UC Course  I have reviewed the triage vital signs and the nursing notes.  Pertinent labs & imaging results that were available during my care of the patient were reviewed by me and considered in my medical decision making (see chart for details).   53 year old female presents for pruritic erythematous vesicular rash of both arms and torso for the past several days.  Symptoms began after mowing her lawn.  Presentation consistent with contact dermatitis.  Treat this time with topical triamcinolone  ointment.  Advised continuing Benadryl, cool compresses.  Reviewed return precautions.   Final Clinical Impressions(s) / UC Diagnoses   Final diagnoses:  Contact dermatitis, unspecified contact dermatitis type, unspecified trigger   Discharge Instructions   None    ED Prescriptions     Medication Sig Dispense Auth. Provider   triamcinolone  ointment (KENALOG ) 0.5 % Apply 1 Application topically 2 (two) times daily. 30 g Shaunna [place]      PDMP not reviewed this encounter.   Floydene Hy, PA-C 07/10/23 1727

## 2023-07-10 NOTE — ED Triage Notes (Signed)
 Pt c/o rash along arms and torso  Pt states that she was mowing her lawn and noticed red bumps along her left arm and it then spread to her right arm and torso.  Pt states that she has had a similar rash after messing with 2 bushes in her yard last year, and mowed around the same bushes this time and put her arm into the bush while mowing.   Pt has been using benadryl cream, alcohol, and allergy medication but it has not helped.

## 2023-11-03 ENCOUNTER — Ambulatory Visit
Admission: EM | Admit: 2023-11-03 | Discharge: 2023-11-03 | Disposition: A | Discharge status: Home / Self Care | Attending: Emergency Medicine | Admitting: Emergency Medicine

## 2023-11-03 DIAGNOSIS — J069 Acute upper respiratory infection, unspecified: Secondary | ICD-10-CM | POA: Insufficient documentation

## 2023-11-03 LAB — GROUP A STREP BY PCR: Group A Strep by PCR: NOT DETECTED

## 2023-11-03 LAB — RESP PANEL BY RT-PCR (FLU A&B, COVID) ARPGX2
Influenza A by PCR: NEGATIVE
Influenza B by PCR: NEGATIVE
SARS Coronavirus 2 by RT PCR: NEGATIVE

## 2023-11-03 MED ORDER — PROMETHAZINE-DM 6.25-15 MG/5ML PO SYRP
5.0000 mL | ORAL_SOLUTION | Freq: Four times a day (QID) | ORAL | 0 refills | Status: AC | PRN
Start: 2023-11-03 — End: ?

## 2023-11-03 MED ORDER — IPRATROPIUM BROMIDE 0.06 % NA SOLN
2.0000 | Freq: Four times a day (QID) | NASAL | 12 refills | Status: AC
Start: 2023-11-03 — End: ?

## 2023-11-03 MED ORDER — BENZONATATE 100 MG PO CAPS: 200.0000 mg | ORAL_CAPSULE | Freq: Three times a day (TID) | ORAL | 0 refills | Status: DC

## 2023-11-03 NOTE — Discharge Instructions (Signed)
 Your testing today was negative for strep, COVID, and influenza.  However, your exam is consistent with a viral respiratory infection.  Please continue to use over-the-counter Tylenol and or ibuprofen  according to the package instructions as needed for any fever or pain.  Use the Atrovent  nasal spray, 2 squirts in each nostril every 6 hours, as needed for runny nose and postnasal drip.  Use the Tessalon  Perles every 8 hours during the day.  Take them with a small sip of water.  They may give you some numbness to the base of your tongue or a metallic taste in your mouth, this is normal.  Use the Promethazine  DM cough syrup at bedtime for cough and congestion.  It will make you drowsy so do not take it during the day.  Return for reevaluation or see your primary care provider for any new or worsening symptoms.

## 2023-11-03 NOTE — ED Triage Notes (Signed)
 Patient states that she went to church Sunday, sx started after that  Scratchy throat the first day Headache Bodyaches  Fever  cough

## 2023-11-03 NOTE — ED Provider Notes (Signed)
 [facility]    CSN: [id number] Arrival date & time: 11/03/23  1437      History   Chief Complaint Chief Complaint  Patient presents with   Fever   Headache    HPI Theresa Ryan is a 53 y.o. female.   HPI  53 year old female with past medical history significant for migraine headaches and HSV-2 presents for evaluation of respiratory symptoms that began 2 days ago after going to church.  She reports runny nose, nasal congestion, sneezing, yellow-green discharge from her nasal passages, left-sided maxillary sinus pressure, ear pain, fatigue, chills, and a nonproductive cough.  No known sick contacts.  She reports that she does have significant allergies and she has not been taking her allergy medication regularly for the last month.  She has taken 2 home COVID test that were negative.  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    Patient Active Problem List   Diagnosis Date Noted   SOB (shortness of breath) 04/29/2023   Palpitations 04/29/2023   Atypical chest pain 04/29/2023   Anxiety 04/29/2023   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) 11/27/2016   STD exposure 11/26/2016   Pelvic pain 10/22/2016   History of cervical dysplasia 10/22/2016   History of D&C 10/22/2016   History of tubal ligation 10/22/2016   Tobacco user 10/22/2016    Past Surgical History:  Procedure Laterality Date   BACK SURGERY     CERVICAL BIOPSY  W/ LOOP ELECTRODE EXCISION     CESAREAN SECTION      CHOLECYSTECTOMY     CRYOTHERAPY     DILATION AND CURETTAGE OF UTERUS     LAPAROSCOPY     TUBAL LIGATION  2006    OB History     Gravida  6   Para  4   Term  2   Preterm  2   AB  2   Living  3      SAB      IAB  2   Ectopic      Multiple      Live Births  3            Home Medications    Prior to Admission medications   Medication Sig Start Date End Date Taking? Authorizing Provider  benzonatate  (TESSALON ) 100 MG capsule Take 2 capsules (200 mg total) by mouth every 8 (eight) hours. 11/03/23  Yes Bernardino Ditch, NP  ipratropium (ATROVENT ) 0.06 % nasal spray Place 2 sprays into both nostrils 4 (four) times daily. 11/03/23  Yes Bernardino Ditch, NP  promethazine -dextromethorphan (PROMETHAZINE -DM) 6.25-15 MG/5ML syrup Take 5 mLs by mouth 4 (four) times daily as needed. 11/03/23  Yes Bernardino Ditch, NP    Family History Family History  Problem Relation Age of Onset   Diabetes Mother    Hypertension Father    Prostate cancer Father    Colon cancer Maternal Grandmother    Diabetes Daughter  Breast cancer Neg Hx    Ovarian cancer Neg Hx     Social History Social History   Tobacco Use   Smoking status: Every Day    Current packs/day: 0.50    Types: Cigarettes   Smokeless tobacco: Never   Tobacco comments:    Off and on since age 42  Vaping Use   Vaping status: Never Used  Substance Use Topics   Alcohol use: Not Currently    Comment: occas   Drug use: Yes    Types: Marijuana    Comment: occ     Allergies   Vicoprofen [hydrocodone-ibuprofen ]   Review of Systems Review of Systems  Constitutional:  Positive for chills, fatigue and fever.  HENT:  Positive for congestion, ear pain, rhinorrhea, sinus pain and sore throat.   Respiratory:  Positive for cough. Negative for shortness of breath and wheezing.   Musculoskeletal:  Positive for arthralgias and myalgias.  Neurological:  Positive for headaches.     Physical Exam Triage Vital Signs ED  Triage Vitals  Encounter Vitals Group     BP      Girls Systolic BP Percentile      Girls Diastolic BP Percentile      Boys Systolic BP Percentile      Boys Diastolic BP Percentile      Pulse      Resp      Temp      Temp src      SpO2      Weight      Height      Head Circumference      Peak Flow      Pain Score      Pain Loc      Pain Education      Exclude from Growth Chart    No data found.  Updated Vital Signs BP 138/85 (BP Location: Right Arm)   Pulse 86   Temp 99 F (37.2 C) (Oral)   Resp 19   Wt 159 lb (72.1 kg)   SpO2 98%   BMI 26.46 kg/m   Visual Acuity Right Eye Distance:   Left Eye Distance:   Bilateral Distance:    Right Eye Near:   Left Eye Near:    Bilateral Near:     Physical Exam Vitals and nursing note reviewed.  Constitutional:      Appearance: Normal appearance. She is not ill-appearing.  HENT:     Head: Normocephalic and atraumatic.     Right Ear: Tympanic membrane, ear canal and external ear normal. There is no impacted cerumen.     Left Ear: Tympanic membrane, ear canal and external ear normal. There is no impacted cerumen.     Nose: Congestion and rhinorrhea present.     Comments: Nasal mucosa is edematous and erythematous with clear discharge in both nares.    Mouth/Throat:     Mouth: Mucous membranes are moist.     Pharynx: Oropharynx is clear. Posterior oropharyngeal erythema present. No oropharyngeal exudate.     Comments: Posterior oropharynx demonstrates erythema with clear postnasal drip.  Tonsillar pillars are unremarkable. Cardiovascular:     Rate and Rhythm: Normal rate and regular rhythm.     Pulses: Normal pulses.     Heart sounds: Normal heart sounds. No murmur heard.    No friction rub. No gallop.  Pulmonary:     Effort: Pulmonary effort is normal.     Breath sounds: Normal breath sounds. No wheezing, rhonchi or  rales.  Musculoskeletal:     Cervical back: Normal range of motion and neck supple. No tenderness.   Lymphadenopathy:     Cervical: No cervical adenopathy.  Skin:    General: Skin is warm and dry.     Capillary Refill: Capillary refill takes less than 2 seconds.     Findings: No rash.  Neurological:     General: No focal deficit present.     Mental Status: She is alert and oriented to person, place, and time.      UC Treatments / Results  Labs (all labs ordered are listed, but only abnormal results are displayed) Labs Reviewed  RESP PANEL BY RT-PCR (FLU A&B, COVID) [id number]  GROUP A STREP BY PCR    EKG   Radiology No results found.  Procedures Procedures (including critical care time)  Medications Ordered in UC Medications - No data to display  Initial Impression / Assessment and Plan / UC Course  I have reviewed the triage vital signs and the nursing notes.  Pertinent labs & imaging results that were available during my care of the patient were reviewed by me and considered in my medical decision making (see chart for details).   Patient is a nontoxic-appearing 53 year old female presenting for evaluation of 2 days worth of respiratory symptoms as outlined in the HPI above.  She does have significant nasal congestion and she has a very nasal sounding voice in the exam room.  She reports that she is blowing large quantities of thick yellow-green mucus from her nose.  She also has a cough but her cough has been nonproductive.  Her sore throat has been waxing and waning and is still present but not nearly to the extent that it was yesterday morning when she woke up.  She reports that yesterday she spent a significant amount of the day sleeping on the couch because of her fatigue.  She did not measure her temperature at home but is sure she had a fever because she would feel hot and chilled, take Tylenol, and then she would begin to sweat.  She does not currently have a thermometer.  Differential diagnose include COVID, influenza, strep pharyngitis, viral respiratory illness.  I  will order a COVID and flu PCR as well as a strep PCR.  Strep PCR is negative.  Respiratory panel is negative for COVID or influenza.  I will discharge patient home with a diagnosis of viral URI with cough.  I will prescribe Atrovent  nasal spray for nasal congestion and Tessalon  Perles and Promethazine  DM cough syrup for cough and congestion.  She may use over-the-counter Tylenol and/or ibuprofen  as needed for fever or pain.  Return precautions reviewed.  Work note provided.   Final Clinical Impressions(s) / UC Diagnoses   Final diagnoses:  Viral URI with cough     Discharge Instructions      Your testing today was negative for strep, COVID, and influenza.  However, your exam is consistent with a viral respiratory infection.  Please continue to use over-the-counter Tylenol and or ibuprofen  according to the package instructions as needed for any fever or pain.  Use the Atrovent  nasal spray, 2 squirts in each nostril every 6 hours, as needed for runny nose and postnasal drip.  Use the Tessalon  Perles every 8 hours during the day.  Take them with a small sip of water.  They may give you some numbness to the base of your tongue or a metallic taste in your mouth, this is  normal.  Use the Promethazine  DM cough syrup at bedtime for cough and congestion.  It will make you drowsy so do not take it during the day.  Return for reevaluation or see your primary care provider for any new or worsening symptoms.      ED Prescriptions     Medication Sig Dispense Auth. Provider   benzonatate  (TESSALON ) 100 MG capsule Take 2 capsules (200 mg total) by mouth every 8 (eight) hours. 21 capsule Bernardino Ditch, NP   ipratropium (ATROVENT ) 0.06 % nasal spray Place 2 sprays into both nostrils 4 (four) times daily. 15 mL Bernardino Ditch, NP   promethazine -dextromethorphan (PROMETHAZINE -DM) 6.25-15 MG/5ML syrup Take 5 mLs by mouth 4 (four) times daily as needed. 118 mL Bernardino Ditch, NP      PDMP not  reviewed this encounter.   Bernardino Ditch, NP 11/03/23 1553

## 2024-02-03 ENCOUNTER — Ambulatory Visit
Admission: EM | Admit: 2024-02-03 | Discharge: 2024-02-03 | Disposition: A | Discharge status: Home / Self Care | Source: Home / Self Care

## 2024-02-03 ENCOUNTER — Ambulatory Visit (HOSPITAL_COMMUNITY): Payer: Self-pay

## 2024-02-03 ENCOUNTER — Encounter: Payer: Self-pay | Admitting: Emergency Medicine

## 2024-02-03 ENCOUNTER — Ambulatory Visit

## 2024-02-03 DIAGNOSIS — J40 Bronchitis, not specified as acute or chronic: Secondary | ICD-10-CM | POA: Diagnosis not present

## 2024-02-03 DIAGNOSIS — R051 Acute cough: Secondary | ICD-10-CM

## 2024-02-03 MED ORDER — BENZONATATE 100 MG PO CAPS: 200.0000 mg | ORAL_CAPSULE | Freq: Three times a day (TID) | ORAL | 0 refills | Status: AC

## 2024-02-03 MED ORDER — IPRATROPIUM BROMIDE 0.06 % NA SOLN: 2.0000 | Freq: Four times a day (QID) | NASAL | 12 refills | Status: AC

## 2024-02-03 MED ORDER — PROMETHAZINE-DM 6.25-15 MG/5ML PO SYRP: 5.0000 mL | ORAL_SOLUTION | Freq: Four times a day (QID) | ORAL | 0 refills | Status: AC | PRN

## 2024-02-03 NOTE — ED Triage Notes (Signed)
 Pt c/o headache, chills, fatigue, and some cough x 3 days. Pt has taken tylenol for her symptoms.

## 2024-02-03 NOTE — Discharge Instructions (Signed)
 Chest x-ray does not show any evidence of pneumonia though your lung sounds suggest bronchitis.  Bronchitis is typically viral.  Continue to use over-the-counter Tylenol and/or ibuprofen  according to the package instructions as needed for any body aches or if you develop a fever.  Use the Tessalon  Perles every 8 hours during the day as needed for cough.  Take them with a small sip of water.  They may give you numbness to the base of your tongue, or metallic taste in your mouth, this is normal.  Use the Promethazine  DM cough syrup at bedtime as needed for cough and congestion.  If you develop any new or worsening symptoms please return for reevaluation or see your primary care provider.

## 2024-02-03 NOTE — ED Provider Notes (Addendum)
 [facility]    CSN: [id number] Arrival date & time: 02/03/24  0848      History   Chief Complaint Chief Complaint  Patient presents with   Fatigue   Chills   Cough    HPI Theresa Ryan is a 53 y.o. female.   HPI  53 year old female with past medical history significant for HSV-2, HSV 1, palpitations, anxiety, and atypical chest pain presents for evaluation of headache, chills, subjective fever, fatigue, and intermittently productive cough for thick green sputum.  No upper respiratory symptoms and she denies shortness of breath or wheezing.  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    Patient Active Problem List   Diagnosis Date Noted   SOB (shortness of breath) 04/29/2023   Palpitations 04/29/2023   Atypical chest pain 04/29/2023   Anxiety 04/29/2023   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) 11/27/2016   STD exposure 11/26/2016   Pelvic pain 10/22/2016   History of cervical dysplasia 10/22/2016   History of D&C 10/22/2016   History of tubal ligation 10/22/2016   Tobacco user 10/22/2016    Past Surgical History:  Procedure Laterality Date   BACK SURGERY     CERVICAL BIOPSY  W/ LOOP ELECTRODE EXCISION     CESAREAN SECTION     CHOLECYSTECTOMY     CRYOTHERAPY     DILATION AND CURETTAGE OF UTERUS     LAPAROSCOPY     TUBAL LIGATION   2006    OB History     Gravida  6   Para  4   Term  2   Preterm  2   AB  2   Living  3      SAB      IAB  2   Ectopic      Multiple      Live Births  3            Home Medications    Prior to Admission medications  Medication Sig Start Date End Date Taking? Authorizing Provider  benzonatate  (TESSALON ) 100 MG capsule Take 2 capsules (200 mg total) by mouth every 8 (eight) hours. 02/03/24   Bernardino Ditch, NP  ipratropium (ATROVENT ) 0.06 % nasal spray Place 2 sprays into both nostrils 4 (four) times daily. 02/03/24   Bernardino Ditch, NP  promethazine -dextromethorphan (PROMETHAZINE -DM) 6.25-15 MG/5ML syrup Take 5 mLs by mouth 4 (four) times daily as needed. 02/03/24   Bernardino Ditch, NP    Family History Family History  Problem Relation Age of Onset   Diabetes Mother    Hypertension Father    Prostate cancer Father    Colon cancer Maternal Grandmother    Diabetes Daughter    Breast cancer Neg Hx    Ovarian cancer Neg Hx     Social History Social History[1]  Allergies   Vicoprofen [hydrocodone-ibuprofen ]   Review of Systems Review of Systems  Constitutional:  Positive for chills, diaphoresis and fever.  HENT:  Negative for congestion, ear pain, rhinorrhea and sore throat.   Respiratory:  Positive for cough. Negative for shortness of breath and wheezing.   Neurological:  Positive for headaches.     Physical Exam Triage Vital Signs ED Triage Vitals  Encounter Vitals Group     BP 02/03/24 1035 (!) 146/94     Girls Systolic BP Percentile --      Girls Diastolic BP Percentile --      Boys Systolic BP Percentile --      Boys Diastolic BP Percentile --      Pulse Rate 02/03/24 1035 82     Resp 02/03/24 1035 16     Temp 02/03/24 1035 98.9 F (37.2 C)     Temp Source 02/03/24 1035 Oral     SpO2 02/03/24 1035 98 %     Weight 02/03/24 1031 150 lb (68 kg)     Height --      Head Circumference --      Peak Flow --      Pain Score 02/03/24 1034 5      Pain Loc --      Pain Education --      Exclude from Growth Chart --    No data found.  Updated Vital Signs BP (!) 146/94 (BP Location: Right Arm)   Pulse 82   Temp 98.9 F (37.2 C) (Oral)   Resp 16   Wt 150 lb (68 kg)   SpO2 98%   BMI 24.96 kg/m   Visual Acuity Right Eye Distance:   Left Eye Distance:   Bilateral Distance:    Right Eye Near:   Left Eye Near:    Bilateral Near:     Physical Exam Vitals and nursing note reviewed.  Constitutional:      Appearance: Normal appearance. She is ill-appearing.  HENT:     Head: Normocephalic and atraumatic.  Cardiovascular:     Rate and Rhythm: Normal rate and regular rhythm.     Pulses: Normal pulses.     Heart sounds: Normal heart sounds. No murmur heard.    No gallop.  Pulmonary:     Effort: Pulmonary effort is normal.     Breath sounds: Wheezing and rhonchi present.     Comments: Coarse lung sounds on the right only.  Left lung fields are clear to auscultation. Skin:    General: Skin is warm and dry.     Capillary Refill: Capillary refill takes less than 2 seconds.     Findings: No rash.  Neurological:     General: No focal deficit present.     Mental Status: She is alert and oriented to person, place, and time.      UC Treatments / Results  Labs (all labs ordered are listed, but only abnormal results are displayed) Labs Reviewed - No data to display  EKG   Radiology No results found.  Procedures Procedures (including critical care time)  Medications Ordered in UC Medications - No data to display  Initial Impression / Assessment and Plan / UC Course  I have reviewed the triage vital signs and the nursing notes.  Pertinent labs & imaging results that were available during my care of the patient were reviewed by me and considered in my medical decision making (see chart for details).   Patient is a pleasant, though  mildly ill-appearing, 53 year old female presenting for evaluation of 3 days worth  of lower respiratory symptoms.  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  The patient reports that she has been around his coworker significantly and she is concerned she may also have developed pneumonia.  She reports that symptoms began midday while at work on Sunday and have continued to intensify.  She has no URI symptoms at all they are all lower respiratory.  She is able to speak in full sentence without dyspnea or tachypnea though she does have coarse lung sounds diffusely on the right.  Her left lung fields are clear to auscultation.  She has taken a home COVID test which was negative.  I will not repeat the COVID test here as it is the same antigen test she took at home.  However, I will obtain a chest x-ray to evaluate for any acute cardiopulmonary pathology.  Chest x-ray independently reviewed and evaluated by me.  Impression: Slight blunting of bilateral costophrenic angles but no definitive infiltrate or effusion noted.  Radiology overread is pending. Radiology impression states no active cardiopulmonary disease.  I will discharge patient home with a diagnosis of bronchitis.  I will prescribe Tessalon  Perles and Promethazine  DM cough syrup for the patient's cough.  She may continue to use over-the-counter Tylenol and/or ibuprofen  as needed for any fever or pain.  Return precautions reviewed.  Work note provided.   Final Clinical Impressions(s) / UC Diagnoses   Final diagnoses:  Acute cough  Bronchitis     Discharge Instructions      Chest x-ray does not show any evidence of pneumonia though your lung sounds suggest bronchitis.  Bronchitis is typically viral.  Continue to use over-the-counter Tylenol and/or ibuprofen  according to the package instructions as needed for any body aches or if you develop a fever.  Use the Tessalon  Perles every 8 hours during the day as  needed for cough.  Take them with a small sip of water.  They may give you numbness to the base of your tongue, or metallic taste in your mouth, this is normal.  Use the Promethazine  DM cough syrup at bedtime as needed for cough and congestion.  If you develop any new or worsening symptoms please return for reevaluation or see your primary care provider.     ED Prescriptions     Medication Sig Dispense Auth. Provider   benzonatate  (TESSALON ) 100 MG capsule Take 2 capsules (200 mg total) by mouth every 8 (eight) hours. 21 capsule Bernardino Ditch, NP   ipratropium (ATROVENT ) 0.06 % nasal spray Place 2 sprays into both nostrils 4 (four) times daily. 15 mL Bernardino Ditch, NP   promethazine -dextromethorphan (PROMETHAZINE -DM) 6.25-15 MG/5ML syrup Take 5 mLs by mouth 4 (four) times daily as needed. 118 mL Bernardino Ditch, NP      PDMP not reviewed this encounter.    Bernardino Ditch, NP 02/03/24 1113     [1]  Social History Tobacco Use   Smoking status: Every Day    Current packs/day: 0.50    Types: Cigarettes   Smokeless tobacco: Never   Tobacco comments:    Off and on since age 85  Vaping Use   Vaping status: Never Used  Substance Use Topics   Alcohol use: Yes    Comment: occas   Drug use: Not Currently    Types: Marijuana    Comment:  daril Bernardino Ditch, NP 02/03/24 [phone]
